# Patient Record
Sex: Male | Born: 1935 | Race: White | Hispanic: No | Marital: Married | Smoking: Former smoker
Health system: Southern US, Community
[De-identification: ages and names within clinical notes are randomized; demographics above are authoritative.]

## PROBLEM LIST (undated history)

## (undated) DIAGNOSIS — Z8719 Personal history of other diseases of the digestive system: Secondary | ICD-10-CM

## (undated) DIAGNOSIS — I10 Essential (primary) hypertension: Secondary | ICD-10-CM

## (undated) DIAGNOSIS — Z955 Presence of coronary angioplasty implant and graft: Secondary | ICD-10-CM

## (undated) DIAGNOSIS — C61 Malignant neoplasm of prostate: Secondary | ICD-10-CM

## (undated) DIAGNOSIS — I219 Acute myocardial infarction, unspecified: Secondary | ICD-10-CM

## (undated) DIAGNOSIS — I4891 Unspecified atrial fibrillation: Secondary | ICD-10-CM

## (undated) DIAGNOSIS — E119 Type 2 diabetes mellitus without complications: Secondary | ICD-10-CM

## (undated) DIAGNOSIS — I2510 Atherosclerotic heart disease of native coronary artery without angina pectoris: Secondary | ICD-10-CM

## (undated) DIAGNOSIS — E785 Hyperlipidemia, unspecified: Secondary | ICD-10-CM

## (undated) DIAGNOSIS — K219 Gastro-esophageal reflux disease without esophagitis: Secondary | ICD-10-CM

## (undated) HISTORY — PX: PROSTATECTOMY: SHX69

## (undated) HISTORY — PX: CORONARY ARTERY BYPASS GRAFT: SHX141

---

## 2004-07-30 ENCOUNTER — Ambulatory Visit: Payer: Self-pay

## 2005-09-10 ENCOUNTER — Inpatient Hospital Stay: Payer: Self-pay | Admitting: Gastroenterology

## 2005-09-24 ENCOUNTER — Ambulatory Visit: Payer: Self-pay | Admitting: Gastroenterology

## 2007-07-01 ENCOUNTER — Ambulatory Visit: Payer: Self-pay | Admitting: Oncology

## 2007-07-08 ENCOUNTER — Ambulatory Visit: Payer: Self-pay | Admitting: Oncology

## 2007-07-31 ENCOUNTER — Ambulatory Visit: Payer: Self-pay | Admitting: Oncology

## 2007-08-31 ENCOUNTER — Ambulatory Visit: Payer: Self-pay | Admitting: Oncology

## 2007-10-26 ENCOUNTER — Ambulatory Visit: Payer: Self-pay | Admitting: Internal Medicine

## 2007-10-31 ENCOUNTER — Ambulatory Visit: Payer: Self-pay | Admitting: Oncology

## 2007-11-12 ENCOUNTER — Ambulatory Visit: Payer: Self-pay | Admitting: Internal Medicine

## 2007-11-18 ENCOUNTER — Ambulatory Visit: Payer: Self-pay | Admitting: Oncology

## 2007-12-01 ENCOUNTER — Ambulatory Visit: Payer: Self-pay | Admitting: Oncology

## 2007-12-01 ENCOUNTER — Ambulatory Visit: Payer: Self-pay | Admitting: Internal Medicine

## 2008-05-02 ENCOUNTER — Ambulatory Visit: Payer: Self-pay | Admitting: Oncology

## 2008-05-25 ENCOUNTER — Ambulatory Visit: Payer: Self-pay | Admitting: Oncology

## 2008-05-30 ENCOUNTER — Ambulatory Visit: Payer: Self-pay | Admitting: Oncology

## 2008-11-30 ENCOUNTER — Ambulatory Visit: Payer: Self-pay | Admitting: Oncology

## 2008-12-30 ENCOUNTER — Ambulatory Visit: Payer: Self-pay | Admitting: Oncology

## 2010-09-26 ENCOUNTER — Ambulatory Visit: Payer: Self-pay | Admitting: Oncology

## 2010-09-26 ENCOUNTER — Ambulatory Visit: Payer: Self-pay | Admitting: Gastroenterology

## 2010-09-30 ENCOUNTER — Ambulatory Visit: Payer: Self-pay | Admitting: Oncology

## 2010-10-02 ENCOUNTER — Ambulatory Visit: Payer: Self-pay | Admitting: Oncology

## 2010-10-23 ENCOUNTER — Ambulatory Visit: Payer: Self-pay | Admitting: Gastroenterology

## 2010-10-25 LAB — PATHOLOGY REPORT

## 2010-10-31 ENCOUNTER — Ambulatory Visit: Payer: Self-pay | Admitting: Oncology

## 2010-12-01 ENCOUNTER — Ambulatory Visit: Payer: Self-pay | Admitting: Oncology

## 2010-12-11 ENCOUNTER — Ambulatory Visit: Payer: Self-pay | Admitting: Gastroenterology

## 2011-02-01 ENCOUNTER — Ambulatory Visit: Payer: Self-pay | Admitting: Oncology

## 2011-03-02 ENCOUNTER — Ambulatory Visit: Payer: Self-pay | Admitting: Oncology

## 2011-05-03 ENCOUNTER — Ambulatory Visit: Payer: Self-pay | Admitting: Oncology

## 2011-05-03 LAB — CBC CANCER CENTER
Basophil %: 0.7 %
Eosinophil #: 0.1 x10 3/mm (ref 0.0–0.7)
Lymphocyte %: 23.6 %
MCHC: 33 g/dL (ref 32.0–36.0)
Monocyte #: 0.6 x10 3/mm (ref 0.0–0.7)
Monocyte %: 7.3 %
Neutrophil #: 5.2 x10 3/mm (ref 1.4–6.5)
Platelet: 209 x10 3/mm (ref 150–440)
RDW: 14.8 % — ABNORMAL HIGH (ref 11.5–14.5)
WBC: 7.8 x10 3/mm (ref 3.8–10.6)

## 2011-05-03 LAB — IRON AND TIBC
Iron Bind.Cap.(Total): 367 ug/dL (ref 250–450)
Iron Saturation: 25 %
Iron: 91 ug/dL (ref 65–175)
Unbound Iron-Bind.Cap.: 276 ug/dL

## 2011-05-31 ENCOUNTER — Ambulatory Visit: Payer: Self-pay | Admitting: Oncology

## 2012-06-10 ENCOUNTER — Ambulatory Visit: Payer: Self-pay | Admitting: Gastroenterology

## 2012-06-23 ENCOUNTER — Ambulatory Visit: Payer: Self-pay | Admitting: Gastroenterology

## 2012-07-27 ENCOUNTER — Telehealth: Payer: Self-pay

## 2012-07-27 ENCOUNTER — Other Ambulatory Visit: Payer: Self-pay

## 2012-07-27 DIAGNOSIS — K319 Disease of stomach and duodenum, unspecified: Secondary | ICD-10-CM

## 2012-07-28 ENCOUNTER — Telehealth: Payer: Self-pay | Admitting: Gastroenterology

## 2012-07-28 NOTE — Telephone Encounter (Signed)
Received a phone call from Mr. Rison' daughter, Lynden Ang, stating that per Dr. Marva Panda the EUS scheduled for 5/8 is to be cancelled.  If there are any questions, call Dr. Marva Panda at [phone]

## 2012-07-28 NOTE — Telephone Encounter (Signed)
Pt has been instructed and meds reviewed pt's daughter will call back with any questions or concerns.  Info also mailed

## 2012-08-04 NOTE — Telephone Encounter (Signed)
I have called and confirmed with Bjorn Loser, Dr Reyes Ivan nurse that patient is indeed to be cancelled for EUS on 08/06/12. Noreene Larsson with endoscopy has been made aware of this and Dr Christella Hartigan has been made aware as well.

## 2012-08-06 ENCOUNTER — Ambulatory Visit (HOSPITAL_COMMUNITY): Admission: RE | Admit: 2012-08-06 | Payer: Medicare Other | Source: Ambulatory Visit | Admitting: Gastroenterology

## 2012-08-06 ENCOUNTER — Encounter (HOSPITAL_COMMUNITY): Admission: RE | Payer: Self-pay | Source: Ambulatory Visit

## 2012-08-06 SURGERY — UPPER ENDOSCOPIC ULTRASOUND (EUS) LINEAR
Anesthesia: Monitor Anesthesia Care

## 2012-10-20 ENCOUNTER — Encounter: Payer: Self-pay | Admitting: Nurse Practitioner

## 2012-10-20 ENCOUNTER — Encounter: Payer: Self-pay | Admitting: Cardiothoracic Surgery

## 2012-10-30 ENCOUNTER — Encounter: Payer: Self-pay | Admitting: Cardiothoracic Surgery

## 2012-12-26 ENCOUNTER — Inpatient Hospital Stay: Payer: Self-pay | Admitting: Internal Medicine

## 2012-12-26 LAB — COMPREHENSIVE METABOLIC PANEL
Alkaline Phosphatase: 113 U/L (ref 50–136)
Anion Gap: 9 (ref 7–16)
Calcium, Total: 9.3 mg/dL (ref 8.5–10.1)
Creatinine: 1.16 mg/dL (ref 0.60–1.30)
EGFR (Non-African Amer.): 58 — ABNORMAL LOW
Osmolality: 293 (ref 275–301)
Potassium: 4.7 mmol/L (ref 3.5–5.1)
SGOT(AST): 24 U/L (ref 15–37)
SGPT (ALT): 20 U/L (ref 12–78)
Total Protein: 7.7 g/dL (ref 6.4–8.2)

## 2012-12-26 LAB — URINALYSIS, COMPLETE
Bacteria: NONE SEEN
Bilirubin,UR: NEGATIVE
Blood: NEGATIVE
Ketone: NEGATIVE
Leukocyte Esterase: NEGATIVE
Nitrite: NEGATIVE
Ph: 5 (ref 4.5–8.0)
Specific Gravity: 1.005 (ref 1.003–1.030)
Squamous Epithelial: <1
WBC UR: <1 /HPF (ref 0–5)

## 2012-12-26 LAB — CBC
HCT: 36.5 % — ABNORMAL LOW (ref 40.0–52.0)
HGB: 11.4 g/dL — ABNORMAL LOW (ref 13.0–18.0)
MCH: 24.1 pg — ABNORMAL LOW (ref 26.0–34.0)
MCHC: 31.1 g/dL — ABNORMAL LOW (ref 32.0–36.0)
MCV: 78 fL — ABNORMAL LOW (ref 80–100)
Platelet: 375 10*3/uL (ref 150–440)
RBC: 4.71 10*6/uL (ref 4.40–5.90)
WBC: 12.3 10*3/uL — ABNORMAL HIGH (ref 3.8–10.6)

## 2012-12-26 LAB — CK TOTAL AND CKMB (NOT AT ARMC): CK-MB: 3.5 ng/mL (ref 0.5–3.6)

## 2012-12-27 LAB — COMPREHENSIVE METABOLIC PANEL
Albumin: 3 g/dL — ABNORMAL LOW (ref 3.4–5.0)
Alkaline Phosphatase: 87 U/L (ref 50–136)
Anion Gap: 3 — ABNORMAL LOW (ref 7–16)
Bilirubin,Total: 0.3 mg/dL (ref 0.2–1.0)
Calcium, Total: 8.5 mg/dL (ref 8.5–10.1)
Chloride: 101 mmol/L (ref 98–107)
Creatinine: 1.2 mg/dL (ref 0.60–1.30)
EGFR (African American): >60
Osmolality: 282 (ref 275–301)
Potassium: 3.8 mmol/L (ref 3.5–5.1)
SGPT (ALT): 21 U/L (ref 12–78)
Sodium: 135 mmol/L — ABNORMAL LOW (ref 136–145)
Total Protein: 6.4 g/dL (ref 6.4–8.2)

## 2012-12-27 LAB — CBC WITH DIFFERENTIAL/PLATELET
Basophil #: 0.1 10*3/uL (ref 0.0–0.1)
HCT: 29.6 % — ABNORMAL LOW (ref 40.0–52.0)
HGB: 9.5 g/dL — ABNORMAL LOW (ref 13.0–18.0)
Lymphocyte %: 18.3 %
MCH: 24.2 pg — ABNORMAL LOW (ref 26.0–34.0)
Monocyte #: 0.8 x10 3/mm (ref 0.2–1.0)
Monocyte %: 8.2 %
Neutrophil #: 7 10*3/uL — ABNORMAL HIGH (ref 1.4–6.5)
Neutrophil %: 72.2 %
Platelet: 236 10*3/uL (ref 150–440)
RBC: 3.91 10*6/uL — ABNORMAL LOW (ref 4.40–5.90)

## 2012-12-27 LAB — LIPID PANEL
Cholesterol: 170 mg/dL (ref 0–200)
Ldl Cholesterol, Calc: 119 mg/dL — ABNORMAL HIGH (ref 0–100)
Triglycerides: 103 mg/dL (ref 0–200)
VLDL Cholesterol, Calc: 21 mg/dL (ref 5–40)

## 2012-12-27 LAB — APTT: Activated PTT: 91 secs — ABNORMAL HIGH (ref 23.6–35.9)

## 2012-12-27 LAB — TROPONIN I: Troponin-I: 18.18 ng/mL — ABNORMAL HIGH

## 2012-12-27 LAB — HEMOGLOBIN A1C: Hemoglobin A1C: 7.5 % — ABNORMAL HIGH (ref 4.2–6.3)

## 2012-12-28 LAB — CBC WITH DIFFERENTIAL/PLATELET
Basophil #: 0.1 10*3/uL (ref 0.0–0.1)
Eosinophil %: 1.2 %
HGB: 9.7 g/dL — ABNORMAL LOW (ref 13.0–18.0)
Lymphocyte #: 1.8 10*3/uL (ref 1.0–3.6)
Lymphocyte %: 23.2 %
MCH: 24.1 pg — ABNORMAL LOW (ref 26.0–34.0)
MCHC: 32.4 g/dL (ref 32.0–36.0)
MCV: 75 fL — ABNORMAL LOW (ref 80–100)
Monocyte #: 0.9 x10 3/mm (ref 0.2–1.0)
RBC: 4.04 10*6/uL — ABNORMAL LOW (ref 4.40–5.90)

## 2012-12-28 LAB — BASIC METABOLIC PANEL
Anion Gap: 5 — ABNORMAL LOW (ref 7–16)
EGFR (Non-African Amer.): >60
Potassium: 3.3 mmol/L — ABNORMAL LOW (ref 3.5–5.1)

## 2012-12-28 LAB — FERRITIN: Ferritin (ARMC): 16 ng/mL (ref 8–388)

## 2012-12-28 LAB — IRON AND TIBC: Unbound Iron-Bind.Cap.: 367 ug/dL

## 2012-12-28 LAB — APTT: Activated PTT: 62.1 secs — ABNORMAL HIGH (ref 23.6–35.9)

## 2012-12-29 DIAGNOSIS — I10 Essential (primary) hypertension: Secondary | ICD-10-CM | POA: Insufficient documentation

## 2012-12-29 DIAGNOSIS — I2510 Atherosclerotic heart disease of native coronary artery without angina pectoris: Secondary | ICD-10-CM | POA: Diagnosis present

## 2012-12-29 DIAGNOSIS — E119 Type 2 diabetes mellitus without complications: Secondary | ICD-10-CM

## 2012-12-29 DIAGNOSIS — I4891 Unspecified atrial fibrillation: Secondary | ICD-10-CM

## 2012-12-29 DIAGNOSIS — E785 Hyperlipidemia, unspecified: Secondary | ICD-10-CM

## 2012-12-29 HISTORY — DX: Unspecified atrial fibrillation: I48.91

## 2012-12-29 HISTORY — DX: Type 2 diabetes mellitus without complications: E11.9

## 2012-12-29 HISTORY — DX: Hyperlipidemia, unspecified: E78.5

## 2012-12-29 HISTORY — DX: Atherosclerotic heart disease of native coronary artery without angina pectoris: I25.10

## 2012-12-29 HISTORY — DX: Essential (primary) hypertension: I10

## 2012-12-31 DIAGNOSIS — Z951 Presence of aortocoronary bypass graft: Secondary | ICD-10-CM | POA: Insufficient documentation

## 2013-02-24 ENCOUNTER — Emergency Department: Payer: Self-pay | Admitting: Emergency Medicine

## 2013-02-24 ENCOUNTER — Encounter (HOSPITAL_COMMUNITY): Payer: Self-pay

## 2013-02-24 ENCOUNTER — Observation Stay (HOSPITAL_COMMUNITY): Payer: Medicare Other

## 2013-02-24 ENCOUNTER — Observation Stay (HOSPITAL_COMMUNITY)
Admission: EM | Admit: 2013-02-24 | Discharge: 2013-02-25 | Disposition: A | Discharge status: Home / Self Care | Payer: Medicare Other | Source: Other Acute Inpatient Hospital | Attending: Neurosurgery | Admitting: Neurosurgery

## 2013-02-24 DIAGNOSIS — S065X9A Traumatic subdural hemorrhage with loss of consciousness of unspecified duration, initial encounter: Secondary | ICD-10-CM | POA: Diagnosis present

## 2013-02-24 DIAGNOSIS — I252 Old myocardial infarction: Secondary | ICD-10-CM | POA: Insufficient documentation

## 2013-02-24 DIAGNOSIS — E119 Type 2 diabetes mellitus without complications: Secondary | ICD-10-CM | POA: Diagnosis present

## 2013-02-24 DIAGNOSIS — I4891 Unspecified atrial fibrillation: Secondary | ICD-10-CM

## 2013-02-24 DIAGNOSIS — W1809XA Striking against other object with subsequent fall, initial encounter: Secondary | ICD-10-CM | POA: Insufficient documentation

## 2013-02-24 DIAGNOSIS — S065XAA Traumatic subdural hemorrhage with loss of consciousness status unknown, initial encounter: Secondary | ICD-10-CM

## 2013-02-24 DIAGNOSIS — Z955 Presence of coronary angioplasty implant and graft: Secondary | ICD-10-CM

## 2013-02-24 DIAGNOSIS — R9431 Abnormal electrocardiogram [ECG] [EKG]: Secondary | ICD-10-CM | POA: Insufficient documentation

## 2013-02-24 DIAGNOSIS — I10 Essential (primary) hypertension: Secondary | ICD-10-CM

## 2013-02-24 DIAGNOSIS — K219 Gastro-esophageal reflux disease without esophagitis: Secondary | ICD-10-CM | POA: Insufficient documentation

## 2013-02-24 DIAGNOSIS — S065X0A Traumatic subdural hemorrhage without loss of consciousness, initial encounter: Principal | ICD-10-CM | POA: Insufficient documentation

## 2013-02-24 DIAGNOSIS — G319 Degenerative disease of nervous system, unspecified: Secondary | ICD-10-CM | POA: Insufficient documentation

## 2013-02-24 DIAGNOSIS — Z8719 Personal history of other diseases of the digestive system: Secondary | ICD-10-CM

## 2013-02-24 DIAGNOSIS — I2510 Atherosclerotic heart disease of native coronary artery without angina pectoris: Secondary | ICD-10-CM

## 2013-02-24 DIAGNOSIS — C61 Malignant neoplasm of prostate: Secondary | ICD-10-CM

## 2013-02-24 HISTORY — DX: Type 2 diabetes mellitus without complications: E11.9

## 2013-02-24 HISTORY — DX: Essential (primary) hypertension: I10

## 2013-02-24 HISTORY — DX: Atherosclerotic heart disease of native coronary artery without angina pectoris: I25.10

## 2013-02-24 HISTORY — DX: Gastro-esophageal reflux disease without esophagitis: K21.9

## 2013-02-24 HISTORY — DX: Malignant neoplasm of prostate: C61

## 2013-02-24 HISTORY — DX: Presence of coronary angioplasty implant and graft: Z95.5

## 2013-02-24 HISTORY — DX: Hyperlipidemia, unspecified: E78.5

## 2013-02-24 HISTORY — DX: Unspecified atrial fibrillation: I48.91

## 2013-02-24 HISTORY — DX: Personal history of other diseases of the digestive system: Z87.19

## 2013-02-24 HISTORY — DX: Acute myocardial infarction, unspecified: I21.9

## 2013-02-24 LAB — CBC
HCT: 29.1 % — ABNORMAL LOW (ref 40.0–52.0)
HGB: 9 g/dL — ABNORMAL LOW (ref 13.0–18.0)
MCH: 23 pg — ABNORMAL LOW (ref 26.0–34.0)
MCHC: 31 g/dL — ABNORMAL LOW (ref 32.0–36.0)
MCV: 74 fL — ABNORMAL LOW (ref 80–100)
RBC: 3.93 10*6/uL — ABNORMAL LOW (ref 4.40–5.90)
RDW: 16 % — ABNORMAL HIGH (ref 11.5–14.5)

## 2013-02-24 LAB — BASIC METABOLIC PANEL
Calcium, Total: 9.1 mg/dL (ref 8.5–10.1)
Chloride: 102 mmol/L (ref 98–107)
Creatinine: 0.8 mg/dL (ref 0.60–1.30)
EGFR (African American): >60
EGFR (Non-African Amer.): >60
Glucose: 111 mg/dL — ABNORMAL HIGH (ref 65–99)
Sodium: 135 mmol/L — ABNORMAL LOW (ref 136–145)

## 2013-02-24 LAB — GLUCOSE, CAPILLARY
Glucose-Capillary: 116 mg/dL — ABNORMAL HIGH (ref 70–99)
Glucose-Capillary: 185 mg/dL — ABNORMAL HIGH (ref 70–99)

## 2013-02-24 LAB — PLATELET INHIBITION P2Y12: Platelet Function  P2Y12: 320 [PRU] (ref 194–418)

## 2013-02-24 LAB — MRSA PCR SCREENING: MRSA by PCR: NEGATIVE

## 2013-02-24 MED ORDER — GLIPIZIDE 5 MG PO TABS
5.0000 mg | ORAL_TABLET | Freq: Two times a day (BID) | ORAL | Status: DC
  Administered 2013-02-24 – 2013-02-25 (×2): 5 mg via ORAL
  Filled 2013-02-24 (×4): qty 1

## 2013-02-24 MED ORDER — HYDROCODONE-ACETAMINOPHEN 5-325 MG PO TABS: 1.0000 | ORAL_TABLET | ORAL | Status: DC | PRN

## 2013-02-24 MED ORDER — SODIUM CHLORIDE 0.9 % IV SOLN
INTRAVENOUS | Status: DC
  Administered 2013-02-24: 14:00:00 via INTRAVENOUS

## 2013-02-24 MED ORDER — CARVEDILOL 12.5 MG PO TABS
12.5000 mg | ORAL_TABLET | Freq: Two times a day (BID) | ORAL | Status: DC
  Administered 2013-02-24 – 2013-02-25 (×2): 12.5 mg via ORAL
  Filled 2013-02-24 (×4): qty 1

## 2013-02-24 MED ORDER — AMIODARONE HCL 200 MG PO TABS
200.0000 mg | ORAL_TABLET | Freq: Every day | ORAL | Status: DC
  Administered 2013-02-24 – 2013-02-25 (×2): 200 mg via ORAL
  Filled 2013-02-24 (×2): qty 1

## 2013-02-24 MED ORDER — PANTOPRAZOLE SODIUM 40 MG PO TBEC
40.0000 mg | DELAYED_RELEASE_TABLET | Freq: Two times a day (BID) | ORAL | Status: DC
  Administered 2013-02-24 – 2013-02-25 (×3): 40 mg via ORAL
  Filled 2013-02-24 (×3): qty 1

## 2013-02-24 MED ORDER — METFORMIN HCL 500 MG PO TABS
1000.0000 mg | ORAL_TABLET | Freq: Two times a day (BID) | ORAL | Status: DC
  Administered 2013-02-24 – 2013-02-25 (×2): 1000 mg via ORAL
  Filled 2013-02-24 (×4): qty 2

## 2013-02-24 MED ORDER — CLOPIDOGREL BISULFATE 75 MG PO TABS
75.0000 mg | ORAL_TABLET | Freq: Every day | ORAL | Status: DC
  Administered 2013-02-24: 75 mg via ORAL
  Filled 2013-02-24 (×2): qty 1

## 2013-02-24 MED ORDER — FUROSEMIDE 40 MG PO TABS
40.0000 mg | ORAL_TABLET | Freq: Every day | ORAL | Status: DC
  Administered 2013-02-24 – 2013-02-25 (×2): 40 mg via ORAL
  Filled 2013-02-24 (×2): qty 1

## 2013-02-24 MED ORDER — POTASSIUM CHLORIDE CRYS ER 20 MEQ PO TBCR
20.0000 meq | EXTENDED_RELEASE_TABLET | Freq: Every day | ORAL | Status: DC
  Administered 2013-02-24 – 2013-02-25 (×2): 20 meq via ORAL
  Filled 2013-02-24 (×2): qty 1

## 2013-02-24 MED ORDER — ONDANSETRON HCL 4 MG PO TABS: 4.0000 mg | ORAL_TABLET | Freq: Four times a day (QID) | ORAL | Status: DC | PRN

## 2013-02-24 MED ORDER — INSULIN DETEMIR 100 UNIT/ML ~~LOC~~ SOLN
10.0000 [IU] | Freq: Every day | SUBCUTANEOUS | Status: DC
  Administered 2013-02-24: 10 [IU] via SUBCUTANEOUS
  Filled 2013-02-24 (×2): qty 0.1

## 2013-02-24 MED ORDER — SUCRALFATE 1 G PO TABS
1.0000 g | ORAL_TABLET | Freq: Three times a day (TID) | ORAL | Status: DC
  Administered 2013-02-24 – 2013-02-25 (×4): 1 g via ORAL
  Filled 2013-02-24 (×7): qty 1

## 2013-02-24 MED ORDER — CLOPIDOGREL BISULFATE 75 MG PO TABS
75.0000 mg | ORAL_TABLET | Freq: Every day | ORAL | Status: DC
  Administered 2013-02-25: 75 mg via ORAL
  Filled 2013-02-24 (×2): qty 1

## 2013-02-24 MED ORDER — ONDANSETRON HCL 4 MG/2ML IJ SOLN: 4.0000 mg | Freq: Four times a day (QID) | INTRAMUSCULAR | Status: DC | PRN

## 2013-02-24 MED ORDER — DOCUSATE SODIUM 50 MG PO CAPS
50.0000 mg | ORAL_CAPSULE | Freq: Two times a day (BID) | ORAL | Status: DC
  Filled 2013-02-24 (×4): qty 1

## 2013-02-24 MED ORDER — INSULIN ASPART 100 UNIT/ML ~~LOC~~ SOLN
0.0000 [IU] | Freq: Three times a day (TID) | SUBCUTANEOUS | Status: DC
  Administered 2013-02-25: 2 [IU] via SUBCUTANEOUS

## 2013-02-24 NOTE — H&P (Signed)
CC:  "I fell"  HPI: [name] is a 79 y.o. male transferred to [facility] from [facility] after suffering a fall. He was in the bathroom and slipped on a rug and hit the back of his head. 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 He denies any preceding lightheadedness, dizziness, or vertigo. He states he remembers the events of the fall. Currently, he denies HA, visual changes, N/V, or new N/T/W.   Of note, he had an MI treated at [facility] ~7 weeks ago which included CABG as well as cardiac stent placement. He has since been on ASA and Plavix.  PMH: Past Medical History  Diagnosis Date  . Myocardial infarction   . GERD (gastroesophageal reflux disease)     PSH: - RECENT mini-open CABG with multiple cardiac stents in OCT 2014 - Cataract surgery - Prostatectomy - Right knee surgery  SH: History  Substance Use Topics  . Smoking status: Former Smoker -- 60 years    Types: Cigarettes  . Smokeless tobacco: Current User    Types: Chew  . Alcohol Use: No    MEDS: Prior to Admission medications   Not on File    ALLERGY: Allergies  Allergen Reactions  . Statins     Muscle pain    NEUROLOGIC EXAM: Awake, alert, oriented Memory and concentration grossly intact Speech fluent, appropriate CN grossly intact Motor exam: Upper Extremities Deltoid Bicep Tricep Grip  Right 5/5 5/5 5/5 5/5  Left 5/5 5/5 5/5 5/5   Lower Extremity IP Quad PF DF EHL  Right 5/5 5/5 5/5 5/5 5/5  Left 5/5 5/5 5/5 5/5 5/5   Sensation grossly intact to LT  IMGAING: Outside CTH reviewed demonstrating left posterior falcine SDH measuring ~85mm without mass effect or MLS. No HCP or skull fx.  IMPRESSION: - 79 y.o. male s/p fall with small left pos falcine SDH, neurologically intact  PLAN: - Cont to observe in NICU - SBP < - Consult cardiology for evaluation of recent cardiac event, specifically risk in stopping ASA/Plavix given.  - Will check PRU   - Repeat CTH this pm.

## 2013-02-24 NOTE — Consult Note (Signed)
Cardiology Consult Note  Admit date: 02/24/2013 Name: [name] 79 y.o.  male DOB:  Nov 03, 1926 MRN:  [id number]  Today's date:  02/24/2013   Referring Physician:  [facility] Neurosurgery  Reason for Consultation:   Subdural hematoma, consultation regarding management of anticoagulation and atrial fibrillation  IMPRESSIONS: 1. Placement of drug-eluting stent 01/04/2013 at [facility] to the right coronary artery and treated since then with aspirin and Plavix 2. Recent 3 vessel coronary artery disease with an internal mammary bypass graft to the LAD 3. Atrial fibrillation of undetermined age of onset 4. Subdural hematoma recently 5. Hypertension  RECOMMENDATION: 1. Obtain platelet reactivity unit tests 2. Continue Plavix and discontinue aspirin 81 mg understanding that there may be some increased risk of stent thrombosis and are balanced with the risk of stent thrombosis versus the risk of a worsening subdural hematoma 3. Long-term followup with his cardiologist in [place] on discharge  HISTORY: This 79 year old male has a history of coronary artery disease and has had stenting multiple years ago in [place] to unknown vessels. He recently presented in October with rapid atrial fibrillation to [facility] and was found to have three-vessel coronary disease. He was transferred to [facility] and underwent a minimally invasive mammary bypass graft to the LAD on October 1 and then had placement of a drug-eluting stent which was a 2.75 mm x 33 mm stent on October 6. He was recommended afterwards that he be maintained on aspirin and Plavix for a minimum of 6 months and after that be maintained on Plavix and Coumadin because of atrial fibrillation. He was in rehabilitation following that and has been seen subsequently by the cardiac surgeon. He fell today and was found to have a small subdural hematoma and was transferred for management to [facility].  Cardiology is asked to evaluate him. He has felt much better since the surgery and has had no recurrence of indigestion. He is not having shortness of breath and denies PND or orthopnea. His echocardiogram done during that admission showed an ejection fraction of 50%.  Past Medical History  Diagnosis Date  . Myocardial infarction   . GERD (gastroesophageal reflux disease)   . CAD 12/29/2012    History of multiple stents and 12 years ago in [place]  Recent internal mammary graft to LAD at [facility] on October , 2014 Recent drug-eluting stents to right coronary artery, Xience 2.75 x 33 mm the mid RCA 01/01/13   . Atrial fibrillation 12/29/2012  . Type II diabetes mellitus 12/29/2012  . H/O: upper GI bleed   . Status post insertion of drug eluting coronary artery stent     01/01/2013 Xience 2.75 x 33 mm to right coronary artery at [facility]   . Hyperlipidemia 12/29/2012  . Essential hypertension 12/29/2012  . Prostate cancer 02/24/2013     Surgical history: Prostatectomy Coronary bypass grafting x1 Allergies:  is allergic to statins.   Medications: Prior to Admission medications   Not on File    Family History: No family status information on file.    Social History:   reports that he has quit smoking. His smoking use included Cigarettes. He smoked 0.00 packs per day for 60 years. His smokeless tobacco use includes Chew. He reports that he does not drink alcohol or use illicit drugs.   History   Social History Narrative  . No narrative on file    Review of Systems: He has a significant history of GI bleeding in the past on nonsteroidal  anti-inflammatory agents. He required multiple transfusions. He has had some urinary frequency as well as nocturia. He has significant arthritis involving his knee that has been an issue. He has had some unsteadiness on his feet since he has had his bypass grafting. Mild dyspnea. Other than as noted above the remainder of the review of systems is  unremarkable. Physical Exam: BP 132/52  Pulse 86  Temp(Src) 97.6 F (36.4 C) (Oral)  Resp 13  Ht 5\' 11"  (1.803 m)  Wt 85.7 kg (188 lb 15 oz)  BMI 26.36 kg/m2  SpO2 100%  General appearance: Elderly male who is mildly pale and currently appears in no acute distress Head: Normocephalic, without obvious abnormality, atraumatic, Balding male hair pattern Eyes: conjunctivae/corneas clear. PERRL, EOM's intact. Fundi benign. Neck: no adenopathy, no carotid bruit, no JVD and supple, symmetrical, trachea midline Lungs: clear to auscultation bilaterally Heart: Irregular rhythm, normal S1-S2, no S3 Abdomen: soft, non-tender; bowel sounds normal; no masses,  no organomegaly Rectal: deferred Extremities: No edema present Pulses: 2+ and symmetric Skin: Multiple ecchymoses present over forearms Neurologic: Grossly normal  Signed:  W. Ashley Royalty MD [facility]   Cardiology Consultant  02/24/2013, 3:05 PM

## 2013-02-24 NOTE — Evaluation (Signed)
Physical Therapy Evaluation Patient Details Name: [name] MRN: [id number] DOB: 1926-12-21 Today's Date: 02/24/2013 Time: 4098-1191 PT Time Calculation (min): 27 min  PT Assessment / Plan / Recommendation History of Present Illness  Pt tripped over rug in bathroom at home and fell. Pt with smal SDH. Pt just d/c'd from rehab yesterday.  Clinical Impression  Pt just d/c'd from rehab yesterday and tripped over throw rug in bathroom. Instructed pt to have daughter pick up all throw rugs to minimalize falls risk. Suspect pt functioning near baseline. Pt to benefit from HHPT upon d/c for home safety eval. Pt remains to require 24/7 supervision for safe d/c home. Pt with all DME needed.     PT Assessment  Patient needs continued PT services    Follow Up Recommendations  Home health PT;Supervision/Assistance - 24 hour    Does the patient have the potential to tolerate intense rehabilitation      Barriers to Discharge        Equipment Recommendations  None recommended by PT (pt has all equip)    Recommendations for Other Services     Frequency Min 3X/week    Precautions / Restrictions Precautions Precautions: Fall Restrictions Weight Bearing Restrictions: No   Pertinent Vitals/Pain Pt denies pain      Mobility  Bed Mobility Bed Mobility: Supine to Sit Supine to Sit: 5: Supervision;HOB elevated Details for Bed Mobility Assistance: pt with use of rails and HOB elevated, however has hospital bed at home Transfers Transfers: Sit to Stand;Stand to Sit Sit to Stand: 4: Min guard;With upper extremity assist;From bed (due to urgency to urinate) Stand to Sit: 5: Supervision;With upper extremity assist;To chair/3-in-1 Details for Transfer Assistance: v/c's for hand placement Ambulation/Gait Ambulation/Gait Assistance: 4: Min guard Ambulation Distance (Feet): 10 Feet Assistive device: Rolling walker Ambulation/Gait Assistance Details: pt with report "I can't walk far b/c of  my knee" Gait Pattern: Step-through pattern;Decreased stride length;Trunk flexed Gait velocity: slow General Gait Details: no episodes of LOB, pt reports walking household distances only Stairs: No    Exercises     PT Diagnosis: Difficulty walking;Generalized weakness  PT Problem List: Decreased strength;Decreased activity tolerance;Decreased balance;Decreased mobility PT Treatment Interventions: DME instruction;Gait training;Functional mobility training;Therapeutic activities;Therapeutic exercise     PT Goals(Current goals can be found in the care plan section) Acute Rehab PT Goals Patient Stated Goal: home PT Goal Formulation: With patient Time For Goal Achievement: 03/03/13 Potential to Achieve Goals: Good  Visit Information  Last PT Received On: 02/24/13 Assistance Needed: +1 History of Present Illness: Pt tripped over rug in bathroom at home and fell. Pt with smal SDH. Pt just d/c'd from rehab yesterday.       Prior Functioning  Home Living Family/patient expects to be discharged to:: Private residence Living Arrangements: Children Available Help at Discharge: Family;Available 24 hours/day Type of Home: House Home Access: Ramped entrance Home Layout: One level Home Equipment: Walker - 2 wheels;Walker - 4 wheels;Wheelchair - manual;Tub bench;Toilet riser;Hospital bed Prior Function Level of Independence: Needs assistance Gait / Transfers Assistance Needed: usese RW, w/c for long distances ADL's / Homemaking Assistance Needed: daughter makes meals Communication Communication: No difficulties Dominant Hand: Right    Cognition  Cognition Arousal/Alertness: Awake/alert Behavior During Therapy: WFL for tasks assessed/performed Overall Cognitive Status: Within Functional Limits for tasks assessed    Extremity/Trunk Assessment Upper Extremity Assessment Upper Extremity Assessment: Overall WFL for tasks assessed Lower Extremity Assessment Lower Extremity Assessment:  Generalized weakness;RLE deficits/detail RLE Deficits / Details: weak knee,  uses brace when walking outside Cervical / Trunk Assessment Cervical / Trunk Assessment: Normal   Balance Balance Balance Assessed: Yes Static Standing Balance Static Standing - Balance Support: No upper extremity supported;During functional activity (using urinal) Static Standing - Level of Assistance: 4: Min assist Static Standing - Comment/# of Minutes: pt required assist for urinal placement, stood x 3 min  End of Session PT - End of Session Equipment Utilized During Treatment: Gait belt Activity Tolerance: Patient tolerated treatment well Patient left: in chair;with call bell/phone within reach Nurse Communication: Mobility status  GP Functional Assessment Tool Used: clinical judgement Functional Limitation: Mobility: Walking and moving around Mobility: Walking and Moving Around Current Status (V7846): At least 1 percent but less than 20 percent impaired, limited or restricted Mobility: Walking and Moving Around Goal Status [phone]): At least 1 percent but less than 20 percent impaired, limited or restricted   Marcene Brawn 02/24/2013, 5:09 PM  Lewis Shock, PT, DPT Pager #: [phone] Office #: [phone]

## 2013-02-25 DIAGNOSIS — I2510 Atherosclerotic heart disease of native coronary artery without angina pectoris: Secondary | ICD-10-CM

## 2013-02-25 DIAGNOSIS — I4891 Unspecified atrial fibrillation: Secondary | ICD-10-CM

## 2013-02-25 DIAGNOSIS — I10 Essential (primary) hypertension: Secondary | ICD-10-CM

## 2013-02-25 LAB — CBC
HCT: 29.8 % — ABNORMAL LOW (ref 39.0–52.0)
Hemoglobin: 8.8 g/dL — ABNORMAL LOW (ref 13.0–17.0)
MCH: 23.3 pg — ABNORMAL LOW (ref 26.0–34.0)
MCHC: 29.5 g/dL — ABNORMAL LOW (ref 30.0–36.0)
RDW: 15.4 % (ref 11.5–15.5)

## 2013-02-25 LAB — BASIC METABOLIC PANEL
BUN: 22 mg/dL (ref 6–23)
Calcium: 9.1 mg/dL (ref 8.4–10.5)
Chloride: 101 mEq/L (ref 96–112)
GFR calc Af Amer: >90 mL/min (ref >90)
GFR calc non Af Amer: 79 mL/min — ABNORMAL LOW (ref >90)
Glucose, Bld: 59 mg/dL — ABNORMAL LOW (ref 70–99)
Potassium: 3.9 mEq/L (ref 3.5–5.1)
Sodium: 136 mEq/L (ref 135–145)

## 2013-02-25 LAB — GLUCOSE, CAPILLARY: Glucose-Capillary: 122 mg/dL — ABNORMAL HIGH (ref 70–99)

## 2013-02-25 NOTE — Discharge Summary (Signed)
Physician Discharge Summary  Patient ID: [name] MRN: [id number] DOB/AGE: 10-08-1926 79 y.o.  Admit date: 02/24/2013 Discharge date: 02/25/2013  Admission Diagnoses:  Discharge Diagnoses:  Active Problems:   Subdural hematoma   CAD   Atrial fibrillation   Type II diabetes mellitus   H/O: upper GI bleed   Status post insertion of drug eluting coronary artery stent   Discharged Condition: good  Hospital Course: Admitted after fall and transfer from [facility]. Showed small amout of falcine blood. No mass effect. Repeat CT shows no change. Awake and alert morning after admit. Tolerating diet. Home day 1, specific instructions given.  Consults: None  Significant Diagnostic Studies: none  Treatments: none  Discharge Exam: Blood pressure 109/39, pulse 87, temperature 97.5 F (36.4 C), temperature source Oral, resp. rate 17, height 5\' 11"  (1.803 m), weight 85.7 kg (188 lb 15 oz), SpO2 97.00%. awake, alert, conversant; no new neuro issues.  Disposition: Final discharge disposition not confirmed     Medication List    ASK your doctor about these medications       acetaminophen 500 MG tablet  Commonly known as:  TYLENOL  Take 500 mg by mouth every 6 (six) hours as needed.     amiodarone 200 MG tablet  Commonly known as:  PACERONE  Take 200 mg by mouth daily.     amLODipine 10 MG tablet  Commonly known as:  NORVASC  Take 10 mg by mouth daily.     aspirin 81 MG tablet  Take 81 mg by mouth daily.     carvedilol 12.5 MG tablet  Commonly known as:  COREG  Take 12.5 mg by mouth 2 (two) times daily with a meal.     Cholecalciferol 1000 UNITS tablet  Take 1,000 Units by mouth daily.     clopidogrel 75 MG tablet  Commonly known as:  PLAVIX  Take 75 mg by mouth daily with breakfast.     cyanocobalamin 1000 MCG/ML injection  Commonly known as:  (VITAMIN B-12)  Inject 1,000 mcg into the muscle every 30 (thirty) days.     furosemide 40 MG tablet  Commonly known  as:  LASIX  Take 20 mg by mouth daily.     glipiZIDE-metformin 2.5-500 MG per tablet  Commonly known as:  METAGLIP  Take 2 tablets by mouth 2 (two) times daily before a meal.     insulin detemir 100 UNIT/ML injection  Commonly known as:  LEVEMIR  Inject 15 Units into the skin at bedtime.     multivitamin with minerals Tabs tablet  Take 1 tablet by mouth daily.     niacin 750 MG CR tablet  Commonly known as:  NIASPAN  Take 750 mg by mouth at bedtime.     nystatin ointment  Commonly known as:  MYCOSTATIN  Apply 1 application topically 2 (two) times daily.     oxycodone 5 MG capsule  Commonly known as:  OXY-IR  Take 5 mg by mouth every 6 (six) hours as needed.     pantoprazole 40 MG tablet  Commonly known as:  PROTONIX  Take 40 mg by mouth 2 (two) times daily.     polyethylene glycol packet  Commonly known as:  MIRALAX / GLYCOLAX  Take 17 g by mouth daily.     potassium chloride 10 MEQ tablet  Commonly known as:  K-DUR,KLOR-CON  Take 20 mEq by mouth daily.     RABEprazole 20 MG tablet  Commonly known as:  ACIPHEX  Take  20 mg by mouth 2 (two) times daily.     ramipril 10 MG capsule  Commonly known as:  ALTACE  Take 10 mg by mouth daily.     sennosides-docusate sodium 8.6-50 MG tablet  Commonly known as:  SENOKOT-S  Take 2 tablets by mouth 2 (two) times daily as needed for constipation.     sucralfate 1 G tablet  Commonly known as:  CARAFATE  Take 1 g by mouth 2 (two) times daily.         At home rest most of the time. Get up 9 or 10 times each day and take a 15 or 20 minute walk. No riding in the car and to your first postoperative appointment. If you have neck surgery you may shower from the chest down starting on the third postoperative day. If you had back surgery he may start showering on the third postoperative day with saran wrap wrapped around your incisional area 3 times. After the shower remove the saran wrap. Take pain medicine as needed and other  medications as instructed. Call my office for an appointment.  SignedReinaldo Meeker, MD 02/25/2013, 8:58 AM

## 2013-02-25 NOTE — Progress Notes (Signed)
SUBJECTIVE:  sleeping  OBJECTIVE:   Vitals:   Filed Vitals:   02/25/13 0600 02/25/13 0700 02/25/13 0800 02/25/13 0809  BP: 121/43 103/60 109/39 109/39  Pulse: 84 83 88 87  Temp:  97.5 F (36.4 C)    TempSrc:  Oral    Resp: 16 12 17    Height:      Weight:      SpO2: 100% 98% 97%    I&O's:   Intake/Output Summary (Last 24 hours) at 02/25/13 0848 Last data filed at 02/25/13 0800  Gross per 24 hour  Intake 888.33 ml  Output   1675 ml  Net -786.67 ml   TELEMETRY: Reviewed telemetry pt in NSR:     PHYSICAL EXAM General: Well developed, well nourished, in no acute distress Head: Eyes PERRLA, No xanthomas.   Normal cephalic and atramatic  Lungs:   Clear bilaterally to auscultation and percussion. Heart:   HRRR S1 S2 Pulses are 2+ & equal. Abdomen: Bowel sounds are positive, abdomen soft and non-tender without masses Extremities:   No clubbing, cyanosis or edema.  DP +1 Neuro: Alert and oriented X 3. Psych:  Good affect, responds appropriately   LABS: Basic Metabolic Panel:  Recent Labs  09/81/19 0420  NA 136  K 3.9  CL 101  CO2 25  GLUCOSE 59*  BUN 22  CREATININE 0.81  CALCIUM 9.1   Liver Function Tests: No results found for this basename: AST, ALT, ALKPHOS, BILITOT, PROT, ALBUMIN,  in the last 72 hours No results found for this basename: LIPASE, AMYLASE,  in the last 72 hours CBC:  Recent Labs  02/25/13 0420  WBC 6.6  HGB 8.8*  HCT 29.8*  MCV 78.8  PLT 203   Cardiac Enzymes: No results found for this basename: CKTOTAL, CKMB, CKMBINDEX, TROPONINI,  in the last 72 hours BNP: No components found with this basename: POCBNP,  D-Dimer: No results found for this basename: DDIMER,  in the last 72 hours Hemoglobin A1C: No results found for this basename: HGBA1C,  in the last 72 hours Fasting Lipid Panel: No results found for this basename: CHOL, HDL, LDLCALC, TRIG, CHOLHDL, LDLDIRECT,  in the last 72 hours Thyroid Function Tests: No results found for  this basename: TSH, T4TOTAL, FREET3, T3FREE, THYROIDAB,  in the last 72 hours Anemia Panel: No results found for this basename: VITAMINB12, FOLATE, FERRITIN, TIBC, IRON, RETICCTPCT,  in the last 72 hours Coag Panel:   No results found for this basename: INR, PROTIME    RADIOLOGY: Ct Head Wo Contrast  02/24/2013   CLINICAL DATA:  Followup traumatic subdural hematoma.  EXAM: CT HEAD WITHOUT CONTRAST  TECHNIQUE: Contiguous axial images were obtained from the base of the skull through the vertex without intravenous contrast.  COMPARISON:  02/24/2013  FINDINGS: Small posterior left parafalcine subdural hematoma remains stable, measuring up to 6 mm in maximal thickness. No other areas of intracranial hemorrhage identified. No evidence of brain edema, mass-effect, or other signs of acute cerebral infarction.  Moderate diffuse cerebral atrophy remains stable. Ventricles are stable in size. No skull abnormality identified.  IMPRESSION: Stable small posterior left parafalcine subdural hematoma, without significant mass effect.  Stable diffuse cerebral atrophy.   Electronically Signed   By: Myles Rosenthal M.D.   On: 02/24/2013 23:57    IMPRESSIONS:  1. Placement of drug-eluting stent 01/04/2013 at [facility] to the right coronary artery and treated since then with aspirin and Plavix  2. Recent 3 vessel coronary artery disease with an  internal mammary bypass graft to the LAD  3. Atrial fibrillation of undetermined age of onset rate controlled 4. Subdural hematoma recently  5. Hypertension - controlled RECOMMENDATION:  1. Continue Plavix and discontinue aspirin 81 mg understanding that there may be some increased risk of stent thrombosis and are balanced with the risk of stent thrombosis versus the risk of a worsening subdural hematoma  2. Long-term followup with his cardiologist in [place] on discharge    Quintella Reichert, MD  02/25/2013  8:48 AM

## 2013-02-25 NOTE — Progress Notes (Signed)
CT reviewed, unchanged from prior CT at [facility]. Cont with Plavix, hold ASA.

## 2013-02-26 LAB — GLUCOSE, CAPILLARY: Glucose-Capillary: 130 mg/dL — ABNORMAL HIGH (ref 70–99)

## 2013-03-10 ENCOUNTER — Encounter: Payer: Self-pay | Admitting: Internal Medicine

## 2013-04-06 ENCOUNTER — Encounter: Payer: Self-pay | Admitting: Internal Medicine

## 2013-04-19 ENCOUNTER — Ambulatory Visit: Payer: Self-pay | Admitting: Oncology

## 2013-04-19 ENCOUNTER — Ambulatory Visit: Payer: Self-pay | Admitting: Gastroenterology

## 2013-04-19 LAB — CBC CANCER CENTER
Basophil #: 0.1 x10 3/mm (ref 0.0–0.1)
Basophil %: 1.3 %
Eosinophil #: 0.2 x10 3/mm (ref 0.0–0.7)
Eosinophil %: 2 %
HCT: 27.7 % — ABNORMAL LOW (ref 40.0–52.0)
HGB: 8 g/dL — ABNORMAL LOW (ref 13.0–18.0)
Lymphocyte #: 1.8 x10 3/mm (ref 1.0–3.6)
Lymphocyte %: 23.8 %
MCH: 19.8 pg — ABNORMAL LOW (ref 26.0–34.0)
MCHC: 29 g/dL — ABNORMAL LOW (ref 32.0–36.0)
MCV: 68 fL — ABNORMAL LOW (ref 80–100)
Monocyte #: 0.8 x10 3/mm (ref 0.2–1.0)
Monocyte %: 10.2 %
Neutrophil #: 4.6 x10 3/mm (ref 1.4–6.5)
Neutrophil %: 62.7 %
Platelet: 284 x10 3/mm (ref 150–440)
RBC: 4.05 10*6/uL — ABNORMAL LOW (ref 4.40–5.90)
RDW: 17.9 % — ABNORMAL HIGH (ref 11.5–14.5)
WBC: 7.4 x10 3/mm (ref 3.8–10.6)

## 2013-04-19 LAB — FOLATE: FOLIC ACID: 25.9 ng/mL (ref 3.1–100.0)

## 2013-04-19 LAB — IRON AND TIBC
IRON BIND. CAP.(TOTAL): 444 ug/dL (ref 250–450)
IRON SATURATION: 4 %
Iron: 18 ug/dL — ABNORMAL LOW (ref 65–175)
Unbound Iron-Bind.Cap.: 426 ug/dL

## 2013-04-19 LAB — FERRITIN: FERRITIN (ARMC): 16 ng/mL (ref 8–388)

## 2013-04-19 LAB — LACTATE DEHYDROGENASE: LDH: 142 U/L (ref 85–241)

## 2013-05-02 ENCOUNTER — Ambulatory Visit: Payer: Self-pay | Admitting: Oncology

## 2013-05-02 ENCOUNTER — Encounter: Payer: Self-pay | Admitting: Internal Medicine

## 2013-05-30 ENCOUNTER — Encounter: Payer: Self-pay | Admitting: Internal Medicine

## 2013-06-17 ENCOUNTER — Ambulatory Visit: Payer: Self-pay | Admitting: Oncology

## 2013-06-17 LAB — CBC CANCER CENTER
BASOS PCT: 0.8 %
Basophil #: 0.1 x10 3/mm (ref 0.0–0.1)
Eosinophil #: 0.2 x10 3/mm (ref 0.0–0.7)
Eosinophil %: 2.4 %
HCT: 35.8 % — ABNORMAL LOW (ref 40.0–52.0)
HGB: 11.1 g/dL — AB (ref 13.0–18.0)
Lymphocyte #: 1.5 x10 3/mm (ref 1.0–3.6)
Lymphocyte %: 21.1 %
MCH: 25 pg — ABNORMAL LOW (ref 26.0–34.0)
MCHC: 31.1 g/dL — ABNORMAL LOW (ref 32.0–36.0)
MCV: 80 fL (ref 80–100)
MONO ABS: 0.7 x10 3/mm (ref 0.2–1.0)
MONOS PCT: 9.5 %
NEUTROS PCT: 66.2 %
Neutrophil #: 4.8 x10 3/mm (ref 1.4–6.5)
Platelet: 212 x10 3/mm (ref 150–440)
RBC: 4.46 10*6/uL (ref 4.40–5.90)
RDW: 20.5 % — ABNORMAL HIGH (ref 11.5–14.5)
WBC: 7.3 x10 3/mm (ref 3.8–10.6)

## 2013-06-17 LAB — IRON AND TIBC
IRON BIND. CAP.(TOTAL): 418 ug/dL (ref 250–450)
IRON SATURATION: 7 %
IRON: 30 ug/dL — AB (ref 65–175)
Unbound Iron-Bind.Cap.: 388 ug/dL

## 2013-06-17 LAB — FERRITIN: Ferritin (ARMC): 18 ng/mL (ref 8–388)

## 2013-06-30 ENCOUNTER — Ambulatory Visit: Payer: Self-pay | Admitting: Oncology

## 2013-09-17 ENCOUNTER — Ambulatory Visit: Payer: Self-pay | Admitting: Oncology

## 2013-09-17 LAB — CBC CANCER CENTER
Basophil #: 0.1 x10 3/mm (ref 0.0–0.1)
Basophil %: 1.1 %
Eosinophil #: 0.1 x10 3/mm (ref 0.0–0.7)
Eosinophil %: 1.4 %
HCT: 34.5 % — AB (ref 40.0–52.0)
HGB: 10.8 g/dL — ABNORMAL LOW (ref 13.0–18.0)
LYMPHS ABS: 1.3 x10 3/mm (ref 1.0–3.6)
LYMPHS PCT: 14.4 %
MCH: 24.9 pg — ABNORMAL LOW (ref 26.0–34.0)
MCHC: 31.2 g/dL — ABNORMAL LOW (ref 32.0–36.0)
MCV: 80 fL (ref 80–100)
MONOS PCT: 7.4 %
Monocyte #: 0.6 x10 3/mm (ref 0.2–1.0)
NEUTROS PCT: 75.7 %
Neutrophil #: 6.6 x10 3/mm — ABNORMAL HIGH (ref 1.4–6.5)
PLATELETS: 223 x10 3/mm (ref 150–440)
RBC: 4.33 10*6/uL — ABNORMAL LOW (ref 4.40–5.90)
RDW: 17.3 % — ABNORMAL HIGH (ref 11.5–14.5)
WBC: 8.7 x10 3/mm (ref 3.8–10.6)

## 2013-09-17 LAB — IRON AND TIBC
IRON: 33 ug/dL — AB (ref 65–175)
Iron Bind.Cap.(Total): 428 ug/dL (ref 250–450)
Iron Saturation: 8 %
UNBOUND IRON-BIND. CAP.: 395 ug/dL

## 2013-09-17 LAB — FERRITIN: Ferritin (ARMC): 10 ng/mL (ref 8–388)

## 2013-09-29 ENCOUNTER — Ambulatory Visit: Payer: Self-pay | Admitting: Oncology

## 2013-11-23 ENCOUNTER — Emergency Department: Payer: Self-pay | Admitting: Internal Medicine

## 2013-11-23 LAB — BASIC METABOLIC PANEL
Anion Gap: 10 (ref 7–16)
BUN: 23 mg/dL — ABNORMAL HIGH (ref 7–18)
CHLORIDE: 99 mmol/L (ref 98–107)
CO2: 28 mmol/L (ref 21–32)
Calcium, Total: 9.1 mg/dL (ref 8.5–10.1)
Creatinine: 0.95 mg/dL (ref 0.60–1.30)
GLUCOSE: 288 mg/dL — AB (ref 65–99)
Osmolality: 288 (ref 275–301)
POTASSIUM: 4.9 mmol/L (ref 3.5–5.1)
Sodium: 137 mmol/L (ref 136–145)

## 2013-11-23 LAB — CBC
HCT: 41.8 % (ref 40.0–52.0)
HGB: 13.6 g/dL (ref 13.0–18.0)
MCH: 28.8 pg (ref 26.0–34.0)
MCHC: 32.5 g/dL (ref 32.0–36.0)
MCV: 89 fL (ref 80–100)
Platelet: 177 10*3/uL (ref 150–440)
RBC: 4.71 10*6/uL (ref 4.40–5.90)
RDW: 17.6 % — ABNORMAL HIGH (ref 11.5–14.5)
WBC: 8.3 10*3/uL (ref 3.8–10.6)

## 2013-11-23 LAB — TROPONIN I: Troponin-I: <0.02 ng/mL

## 2013-11-23 LAB — PRO B NATRIURETIC PEPTIDE: B-Type Natriuretic Peptide: 1072 pg/mL — ABNORMAL HIGH (ref 0–450)

## 2013-12-22 ENCOUNTER — Ambulatory Visit: Payer: Self-pay | Admitting: Oncology

## 2013-12-22 LAB — IRON AND TIBC
IRON SATURATION: 25 %
Iron Bind.Cap.(Total): 335 ug/dL (ref 250–450)
Iron: 84 ug/dL (ref 65–175)
UNBOUND IRON-BIND. CAP.: 251 ug/dL

## 2013-12-22 LAB — CBC CANCER CENTER
Basophil #: 0.1 x10 3/mm (ref 0.0–0.1)
Basophil %: 1.1 %
EOS ABS: 0.2 x10 3/mm (ref 0.0–0.7)
Eosinophil %: 2.6 %
HCT: 41.4 % (ref 40.0–52.0)
HGB: 13.6 g/dL (ref 13.0–18.0)
Lymphocyte #: 2.1 x10 3/mm (ref 1.0–3.6)
Lymphocyte %: 23.1 %
MCH: 29.2 pg (ref 26.0–34.0)
MCHC: 32.9 g/dL (ref 32.0–36.0)
MCV: 89 fL (ref 80–100)
MONOS PCT: 8.3 %
Monocyte #: 0.8 x10 3/mm (ref 0.2–1.0)
NEUTROS PCT: 64.9 %
Neutrophil #: 5.9 x10 3/mm (ref 1.4–6.5)
PLATELETS: 195 x10 3/mm (ref 150–440)
RBC: 4.65 10*6/uL (ref 4.40–5.90)
RDW: 15.3 % — ABNORMAL HIGH (ref 11.5–14.5)
WBC: 9.1 x10 3/mm (ref 3.8–10.6)

## 2013-12-22 LAB — FERRITIN: FERRITIN (ARMC): 26 ng/mL (ref 8–388)

## 2013-12-30 ENCOUNTER — Ambulatory Visit: Payer: Self-pay | Admitting: Oncology

## 2013-12-31 ENCOUNTER — Emergency Department: Payer: Self-pay | Admitting: Emergency Medicine

## 2014-01-01 LAB — CBC
HCT: 39.8 % — AB (ref 40.0–52.0)
HGB: 12.9 g/dL — ABNORMAL LOW (ref 13.0–18.0)
MCH: 28.9 pg (ref 26.0–34.0)
MCHC: 32.4 g/dL (ref 32.0–36.0)
MCV: 89 fL (ref 80–100)
Platelet: 203 10*3/uL (ref 150–440)
RBC: 4.47 10*6/uL (ref 4.40–5.90)
RDW: 14.8 % — ABNORMAL HIGH (ref 11.5–14.5)
WBC: 11.5 10*3/uL — AB (ref 3.8–10.6)

## 2014-01-01 LAB — URINALYSIS, COMPLETE
BACTERIA: NONE SEEN
Bilirubin,UR: NEGATIVE
Blood: NEGATIVE
Glucose,UR: NEGATIVE mg/dL (ref 0–75)
Ketone: NEGATIVE
Leukocyte Esterase: NEGATIVE
Nitrite: NEGATIVE
PH: 5 (ref 4.5–8.0)
Protein: NEGATIVE
SPECIFIC GRAVITY: 1.009 (ref 1.003–1.030)
SQUAMOUS EPITHELIAL: NONE SEEN
WBC UR: NONE SEEN /HPF (ref 0–5)

## 2014-01-01 LAB — COMPREHENSIVE METABOLIC PANEL
ALK PHOS: 78 U/L
ALT: 14 U/L
Albumin: 3.4 g/dL (ref 3.4–5.0)
Anion Gap: 7 (ref 7–16)
BUN: 24 mg/dL — ABNORMAL HIGH (ref 7–18)
Bilirubin,Total: 0.6 mg/dL (ref 0.2–1.0)
CALCIUM: 9.2 mg/dL (ref 8.5–10.1)
CHLORIDE: 102 mmol/L (ref 98–107)
CO2: 29 mmol/L (ref 21–32)
Creatinine: 1.06 mg/dL (ref 0.60–1.30)
EGFR (Non-African Amer.): >60
Glucose: 141 mg/dL — ABNORMAL HIGH (ref 65–99)
OSMOLALITY: 282 (ref 275–301)
Potassium: 3.8 mmol/L (ref 3.5–5.1)
SGOT(AST): 12 U/L — ABNORMAL LOW (ref 15–37)
Sodium: 138 mmol/L (ref 136–145)
Total Protein: 7.3 g/dL (ref 6.4–8.2)

## 2014-01-01 LAB — CK TOTAL AND CKMB (NOT AT ARMC)
CK, TOTAL: 49 U/L
CK, Total: 52 U/L
CK, Total: 49 U/L
CK-MB: 1.5 ng/mL (ref 0.5–3.6)
CK-MB: 1.3 ng/mL (ref 0.5–3.6)
CK-MB: 1.2 ng/mL (ref 0.5–3.6)

## 2014-01-01 LAB — LIPASE, BLOOD: LIPASE: 101 U/L (ref 73–393)

## 2014-01-01 LAB — MAGNESIUM: Magnesium: 1.3 mg/dL — ABNORMAL LOW

## 2014-01-01 LAB — TROPONIN I
TROPONIN-I: 0.02 ng/mL
TROPONIN-I: 0.02 ng/mL
Troponin-I: 0.02 ng/mL

## 2014-01-01 LAB — PROTIME-INR
INR: 1.1
Prothrombin Time: 14.1 secs (ref 11.5–14.7)

## 2014-01-02 ENCOUNTER — Ambulatory Visit: Payer: Self-pay | Admitting: Orthopedic Surgery

## 2014-01-02 LAB — BASIC METABOLIC PANEL
ANION GAP: 7 (ref 7–16)
BUN: 21 mg/dL — AB (ref 7–18)
CHLORIDE: 104 mmol/L (ref 98–107)
CO2: 28 mmol/L (ref 21–32)
CREATININE: 0.87 mg/dL (ref 0.60–1.30)
Calcium, Total: 8.5 mg/dL (ref 8.5–10.1)
EGFR (Non-African Amer.): >60
GLUCOSE: 155 mg/dL — AB (ref 65–99)
OSMOLALITY: 284 (ref 275–301)
Potassium: 3.4 mmol/L — ABNORMAL LOW (ref 3.5–5.1)
Sodium: 139 mmol/L (ref 136–145)

## 2014-01-02 LAB — CBC WITH DIFFERENTIAL/PLATELET
BASOS PCT: 0.7 %
Basophil #: 0.1 10*3/uL (ref 0.0–0.1)
EOS PCT: 1.3 %
Eosinophil #: 0.1 10*3/uL (ref 0.0–0.7)
HCT: 36.6 % — AB (ref 40.0–52.0)
HGB: 12.4 g/dL — ABNORMAL LOW (ref 13.0–18.0)
LYMPHS ABS: 1.2 10*3/uL (ref 1.0–3.6)
Lymphocyte %: 13.4 %
MCH: 29.7 pg (ref 26.0–34.0)
MCHC: 33.8 g/dL (ref 32.0–36.0)
MCV: 88 fL (ref 80–100)
MONO ABS: 1 x10 3/mm (ref 0.2–1.0)
MONOS PCT: 11.3 %
NEUTROS ABS: 6.6 10*3/uL — AB (ref 1.4–6.5)
Neutrophil %: 73.3 %
Platelet: 184 10*3/uL (ref 150–440)
RBC: 4.17 10*6/uL — AB (ref 4.40–5.90)
RDW: 14.6 % — ABNORMAL HIGH (ref 11.5–14.5)
WBC: 9 10*3/uL (ref 3.8–10.6)

## 2014-01-02 LAB — MAGNESIUM: MAGNESIUM: 1.8 mg/dL

## 2014-01-02 LAB — CLOSTRIDIUM DIFFICILE(ARMC)

## 2014-01-03 ENCOUNTER — Inpatient Hospital Stay: Payer: Self-pay | Admitting: Internal Medicine

## 2014-01-03 LAB — CBC WITH DIFFERENTIAL/PLATELET
BASOS ABS: 0.1 10*3/uL (ref 0.0–0.1)
Basophil %: 0.6 %
Eosinophil #: 0.2 10*3/uL (ref 0.0–0.7)
Eosinophil %: 1.9 %
HCT: 36.2 % — AB (ref 40.0–52.0)
HGB: 12.2 g/dL — AB (ref 13.0–18.0)
Lymphocyte #: 1.3 10*3/uL (ref 1.0–3.6)
Lymphocyte %: 14.4 %
MCH: 29.8 pg (ref 26.0–34.0)
MCHC: 33.7 g/dL (ref 32.0–36.0)
MCV: 89 fL (ref 80–100)
MONO ABS: 0.9 x10 3/mm (ref 0.2–1.0)
MONOS PCT: 10.3 %
Neutrophil #: 6.5 10*3/uL (ref 1.4–6.5)
Neutrophil %: 72.8 %
PLATELETS: 168 10*3/uL (ref 150–440)
RBC: 4.08 10*6/uL — AB (ref 4.40–5.90)
RDW: 14.9 % — ABNORMAL HIGH (ref 11.5–14.5)
WBC: 8.9 10*3/uL (ref 3.8–10.6)

## 2014-01-03 LAB — BASIC METABOLIC PANEL
Anion Gap: 6 — ABNORMAL LOW (ref 7–16)
BUN: 17 mg/dL (ref 7–18)
CHLORIDE: 106 mmol/L (ref 98–107)
CREATININE: 0.74 mg/dL (ref 0.60–1.30)
Calcium, Total: 8.5 mg/dL (ref 8.5–10.1)
Co2: 28 mmol/L (ref 21–32)
GLUCOSE: 78 mg/dL (ref 65–99)
OSMOLALITY: 280 (ref 275–301)
Potassium: 3 mmol/L — ABNORMAL LOW (ref 3.5–5.1)
SODIUM: 140 mmol/L (ref 136–145)

## 2014-01-04 LAB — BASIC METABOLIC PANEL
Anion Gap: 4 — ABNORMAL LOW (ref 7–16)
BUN: 12 mg/dL (ref 7–18)
CREATININE: 0.87 mg/dL (ref 0.60–1.30)
Calcium, Total: 8.2 mg/dL — ABNORMAL LOW (ref 8.5–10.1)
Chloride: 106 mmol/L (ref 98–107)
Co2: 28 mmol/L (ref 21–32)
Glucose: 128 mg/dL — ABNORMAL HIGH (ref 65–99)
OSMOLALITY: 277 (ref 275–301)
Potassium: 3.1 mmol/L — ABNORMAL LOW (ref 3.5–5.1)
Sodium: 138 mmol/L (ref 136–145)

## 2014-01-04 LAB — CBC WITH DIFFERENTIAL/PLATELET
Basophil #: 0.1 10*3/uL (ref 0.0–0.1)
Basophil %: 0.6 %
Eosinophil #: 0.1 10*3/uL (ref 0.0–0.7)
Eosinophil %: 1.5 %
HCT: 35.7 % — AB (ref 40.0–52.0)
HGB: 11.6 g/dL — ABNORMAL LOW (ref 13.0–18.0)
Lymphocyte #: 1.1 10*3/uL (ref 1.0–3.6)
Lymphocyte %: 12 %
MCH: 29.2 pg (ref 26.0–34.0)
MCHC: 32.5 g/dL (ref 32.0–36.0)
MCV: 90 fL (ref 80–100)
Monocyte #: 0.9 x10 3/mm (ref 0.2–1.0)
Monocyte %: 9.6 %
NEUTROS PCT: 76.3 %
Neutrophil #: 7.1 10*3/uL — ABNORMAL HIGH (ref 1.4–6.5)
PLATELETS: 164 10*3/uL (ref 150–440)
RBC: 3.96 10*6/uL — AB (ref 4.40–5.90)
RDW: 14.5 % (ref 11.5–14.5)
WBC: 9.3 10*3/uL (ref 3.8–10.6)

## 2014-01-05 LAB — CBC WITH DIFFERENTIAL/PLATELET
BASOS PCT: 0.6 %
Basophil #: 0.1 10*3/uL (ref 0.0–0.1)
EOS PCT: 0.9 %
Eosinophil #: 0.1 10*3/uL (ref 0.0–0.7)
HCT: 37.3 % — AB (ref 40.0–52.0)
HGB: 12.5 g/dL — ABNORMAL LOW (ref 13.0–18.0)
LYMPHS ABS: 0.9 10*3/uL — AB (ref 1.0–3.6)
Lymphocyte %: 8.1 %
MCH: 30.1 pg (ref 26.0–34.0)
MCHC: 33.6 g/dL (ref 32.0–36.0)
MCV: 90 fL (ref 80–100)
Monocyte #: 1.1 x10 3/mm — ABNORMAL HIGH (ref 0.2–1.0)
Monocyte %: 10.5 %
NEUTROS PCT: 79.9 %
Neutrophil #: 8.5 10*3/uL — ABNORMAL HIGH (ref 1.4–6.5)
PLATELETS: 177 10*3/uL (ref 150–440)
RBC: 4.16 10*6/uL — ABNORMAL LOW (ref 4.40–5.90)
RDW: 14.3 % (ref 11.5–14.5)
WBC: 10.6 10*3/uL (ref 3.8–10.6)

## 2014-01-05 LAB — BASIC METABOLIC PANEL
ANION GAP: 5 — AB (ref 7–16)
BUN: 10 mg/dL (ref 7–18)
Calcium, Total: 8.8 mg/dL (ref 8.5–10.1)
Chloride: 107 mmol/L (ref 98–107)
Co2: 27 mmol/L (ref 21–32)
Creatinine: 0.74 mg/dL (ref 0.60–1.30)
EGFR (Non-African Amer.): >60
Glucose: 171 mg/dL — ABNORMAL HIGH (ref 65–99)
OSMOLALITY: 281 (ref 275–301)
Potassium: 3.4 mmol/L — ABNORMAL LOW (ref 3.5–5.1)
SODIUM: 139 mmol/L (ref 136–145)

## 2014-01-06 LAB — BASIC METABOLIC PANEL
Anion Gap: 10 (ref 7–16)
BUN: 13 mg/dL (ref 7–18)
CALCIUM: 8.6 mg/dL (ref 8.5–10.1)
Chloride: 102 mmol/L (ref 98–107)
Co2: 26 mmol/L (ref 21–32)
Creatinine: 0.72 mg/dL (ref 0.60–1.30)
GLUCOSE: 159 mg/dL — AB (ref 65–99)
OSMOLALITY: 279 (ref 275–301)
Potassium: 3.4 mmol/L — ABNORMAL LOW (ref 3.5–5.1)
SODIUM: 138 mmol/L (ref 136–145)

## 2014-01-07 ENCOUNTER — Ambulatory Visit: Payer: Self-pay | Admitting: Oncology

## 2014-01-07 ENCOUNTER — Encounter: Payer: Self-pay | Admitting: Internal Medicine

## 2014-01-13 LAB — BASIC METABOLIC PANEL
ANION GAP: 10 (ref 7–16)
BUN: 28 mg/dL — AB (ref 7–18)
CHLORIDE: 105 mmol/L (ref 98–107)
CO2: 29 mmol/L (ref 21–32)
Calcium, Total: 8.8 mg/dL (ref 8.5–10.1)
Creatinine: 0.91 mg/dL (ref 0.60–1.30)
Glucose: 50 mg/dL — ABNORMAL LOW (ref 65–99)
OSMOLALITY: 290 (ref 275–301)
POTASSIUM: 3.4 mmol/L — AB (ref 3.5–5.1)
SODIUM: 144 mmol/L (ref 136–145)

## 2014-01-18 LAB — BASIC METABOLIC PANEL
ANION GAP: 7 (ref 7–16)
BUN: 27 mg/dL — ABNORMAL HIGH (ref 7–18)
CALCIUM: 9 mg/dL (ref 8.5–10.1)
CHLORIDE: 105 mmol/L (ref 98–107)
CO2: 30 mmol/L (ref 21–32)
CREATININE: 0.85 mg/dL (ref 0.60–1.30)
Glucose: 52 mg/dL — ABNORMAL LOW (ref 65–99)
OSMOLALITY: 286 (ref 275–301)
POTASSIUM: 3.3 mmol/L — AB (ref 3.5–5.1)
Sodium: 142 mmol/L (ref 136–145)

## 2014-01-25 LAB — BASIC METABOLIC PANEL
Anion Gap: 10 (ref 7–16)
BUN: 28 mg/dL — ABNORMAL HIGH (ref 7–18)
CALCIUM: 8.8 mg/dL (ref 8.5–10.1)
CHLORIDE: 107 mmol/L (ref 98–107)
CO2: 27 mmol/L (ref 21–32)
Creatinine: 0.85 mg/dL (ref 0.60–1.30)
EGFR (Non-African Amer.): >60
GLUCOSE: 56 mg/dL — AB (ref 65–99)
Osmolality: 290 (ref 275–301)
Potassium: 3.8 mmol/L (ref 3.5–5.1)
Sodium: 144 mmol/L (ref 136–145)

## 2014-01-30 ENCOUNTER — Encounter: Payer: Self-pay | Admitting: Internal Medicine

## 2014-02-02 LAB — CLOSTRIDIUM DIFFICILE(ARMC)

## 2014-03-01 ENCOUNTER — Encounter: Payer: Self-pay | Admitting: Internal Medicine

## 2014-04-01 ENCOUNTER — Encounter: Payer: Self-pay | Admitting: Internal Medicine

## 2014-04-26 ENCOUNTER — Ambulatory Visit: Payer: Self-pay | Admitting: Vascular Surgery

## 2014-04-26 LAB — BASIC METABOLIC PANEL
Anion Gap: 7 (ref 7–16)
BUN: 37 mg/dL — ABNORMAL HIGH (ref 7–18)
CHLORIDE: 105 mmol/L (ref 98–107)
CREATININE: 1.34 mg/dL — AB (ref 0.60–1.30)
Calcium, Total: 9.5 mg/dL (ref 8.5–10.1)
Co2: 30 mmol/L (ref 21–32)
EGFR (African American): >60
EGFR (Non-African Amer.): 54 — ABNORMAL LOW
GLUCOSE: 177 mg/dL — AB (ref 65–99)
OSMOLALITY: 296 (ref 275–301)
Potassium: 4.6 mmol/L (ref 3.5–5.1)
SODIUM: 142 mmol/L (ref 136–145)

## 2014-05-31 ENCOUNTER — Ambulatory Visit
Admit: 2014-05-31 | Disposition: A | Discharge status: Home / Self Care | Payer: Self-pay | Attending: Internal Medicine | Admitting: Internal Medicine

## 2014-06-20 ENCOUNTER — Inpatient Hospital Stay: Payer: Self-pay | Admitting: Internal Medicine

## 2014-06-23 LAB — CBC WITH DIFFERENTIAL/PLATELET
BASOS ABS: 0 10*3/uL (ref 0.0–0.1)
Basophil %: 0.5 %
Eosinophil #: 0 10*3/uL (ref 0.0–0.7)
Eosinophil %: 0.2 %
HCT: 25.7 % — AB (ref 40.0–52.0)
HGB: 7.8 g/dL — ABNORMAL LOW (ref 13.0–18.0)
LYMPHS ABS: 1.1 10*3/uL (ref 1.0–3.6)
LYMPHS PCT: 11.4 %
MCH: 25.9 pg — ABNORMAL LOW (ref 26.0–34.0)
MCHC: 30.5 g/dL — ABNORMAL LOW (ref 32.0–36.0)
MCV: 85 fL (ref 80–100)
Monocyte #: 1 x10 3/mm (ref 0.2–1.0)
Monocyte %: 10.5 %
NEUTROS ABS: 7.2 10*3/uL — AB (ref 1.4–6.5)
Neutrophil %: 77.4 %
Platelet: 242 10*3/uL (ref 150–440)
RBC: 3.02 10*6/uL — ABNORMAL LOW (ref 4.40–5.90)
RDW: 14.8 % — ABNORMAL HIGH (ref 11.5–14.5)
WBC: 9.3 10*3/uL (ref 3.8–10.6)

## 2014-06-23 LAB — BASIC METABOLIC PANEL
Anion Gap: 10 (ref 7–16)
BUN: 79 mg/dL — ABNORMAL HIGH
CO2: 25 mmol/L
CREATININE: 2.51 mg/dL — AB
Calcium, Total: 8.4 mg/dL — ABNORMAL LOW
Chloride: 101 mmol/L
EGFR (Non-African Amer.): 22 — ABNORMAL LOW
GFR CALC AF AMER: 26 — AB
GLUCOSE: 145 mg/dL — AB
Potassium: 3.6 mmol/L
Sodium: 136 mmol/L

## 2014-07-01 ENCOUNTER — Ambulatory Visit
Admit: 2014-07-01 | Disposition: A | Discharge status: Home / Self Care | Payer: Self-pay | Attending: Internal Medicine | Admitting: Internal Medicine

## 2014-07-01 DEATH — deceased

## 2014-07-22 NOTE — Discharge Summary (Signed)
PATIENT NAME:  [name], [name] MR#:  [id number] DATE OF BIRTH:  February 07, 1927  DATE OF ADMISSION:  12/26/2012 DATE OF DISCHARGE:  12/28/2012  DISCHARGE DIAGNOSES:  1.  Anterior myocardial infarction with three-vessel coronary artery disease.  2.  Type 2 diabetes.  3.  Hypertension.   DISCHARGE MEDICATIONS: Ultimately per [facility], currently being treated with heparin, beta blockers and antiplatelet agents for his MI.   HISTORY AND PHYSICAL: Please see detailed history and physical on admission.   HOSPITAL COURSE: The patient was admitted, ruled in for MI. Troponins reaching up to 17. Cardiology was consulted. Cardiac catheterization was done showing severe three vessel coronary disease with LV dysfunction of 30%. Please see cath report for further details. He did well with that. Coronary artery bypass grafting was recommended by cardiology. The patient did agree to that and ultimately suspended his DO NOT RESUSCITATE status in order to have CABG done. He was transferred to [facility] without further difficulty for such.   ____________________________ Ocie Cornfield. Ouida Sills, MD mwa:aw D: 12/29/2012 08:07:57 ET T: 12/29/2012 08:17:12 ET JOB#: [id number]  cc: Ocie Cornfield. Ouida Sills, MD, <Dictator> Kirk Ruths MD ELECTRONICALLY SIGNED 12/31/2012 13:23

## 2014-07-22 NOTE — Consult Note (Signed)
PATIENT NAME:  MELINDA, POTTINGER MR#:  [id number] DATE OF BIRTH:  [birth year]  CARDIOLOGY CONSULTATION REPORT  DATE OF CONSULTATION:  12/27/2012  CONSULTING PHYSICIAN:  Dr. Bridgett Larsson.  REASON FOR CONSULTATION: Atrial fibrillation, with rapid ventricular rate, coronary artery disease, hypertension, hyperlipidemia, sleep apnea with acute myocardial infarction.   CHIEF COMPLAINT:  Heartburn.  HISTORY OF PRESENT ILLNESS: This is an 81 year old male with known coronary artery disease status post previous stenting in 4 different arteries, hypertension, and sleep apnea, on appropriate medications with some waxing and waning heartburn-type symptoms until he had severe substernal chest discomfort radiating into the back, with atrial fibrillation with a rapid ventricular rate. The patient has had full resolution of his chest discomfort, with an EKG showing atrial fibrillation and nonspecific ST and T wave changes. The patient additionally has had an elevation of troponin of 17, consistent with sub-endocardial myocardial infarction. Heart rate is better-controlled on Cardizem at this time. He previously has been on labetalol, Norvasc, ACE inhibitor and Lasix for congestive heart failure. He has had some pulmonary edema, consistent with minimal acute systolic dysfunction, congestive heart failure.   The remainder review of systems negative for vision changes, ringing in the ears, hearing loss, cough, congestion, heartburn, nausea, vomiting, diarrhea, bloody stools, stomach pain, extremity pain, leg weakness, cramping of the buttocks, known blood clots, headaches, blackouts, dizzy spells, nosebleeds, congestion, trouble swallowing, frequent urination, urination at night, muscle weakness, numbness, anxiety, depression, skin lesions, or skin rashes.   PAST MEDICAL HISTORY: 1.  Atrial fibrillation.  2.  Hypertension.  3.  Hyperlipidemia.  4.  Sleep apnea.  5.  Coronary artery disease.   FAMILY HISTORY: No family  members with early onset of cardiovascular disease or hypertension.   SOCIAL HISTORY: Currently denies alcohol or tobacco use.   HE HAS ALLERGIES TO MEDICATIONS AS LISTED.   PHYSICAL EXAM: VITAL SIGNS: Blood pressure is 110/68 bilaterally, heart rate is 68 upright, reclining, and irregular.  GENERAL: He is a well-appearing elderly male in no acute distress.  HEENT: No icterus, thyromegaly, ulcers, hemorrhage, or xanthelasma.  CARDIOVASCULAR: Irregularly irregular with normal S1 and S2, 2/6 apical murmur consistent with mitral regurgitation. PMI is diffuse. Carotid upstroke normal, without bruit. Jugular venous pressure is normal.  LUNGS: Lungs have bibasilar crackles with a few expiratory wheezes.  ABDOMEN: Soft, nontender, without hepatosplenomegaly or masses. Abdominal aorta is normal size, without bruit.  EXTREMITIES: 2+ radial, femoral, dorsal pedal pulses, with trace lower extremity edema. No cyanosis, clubbing or ulcers.  NEUROLOGIC: He is oriented to time, place, and person, with normal mood and affect.   ASSESSMENT: An 81 year old male with hypertension, hyperlipidemia, sleep apnea, coronary artery disease, atrial fibrillation, with acute onset of atrial fibrillation and rapid ventricular rate likely, secondary to acute myocardial infarction, with mild acute systolic dysfunction, congestive heart failure.   RECOMMENDATIONS: 1.  Lasix for further treatment of systolic flow heart failure.  2. Heparin, nitroglycerin, oxygen and beta blocker for heart rate control and acute myocardial infarction.  3.  Serial ECG and enzymes to assess extent of myocardial infarction.  4.  Proceed to cardiac catheterization to assess coronary anatomy and further treatment thereof, as necessary. Patient understands the risks and benefits of cardiac catheterization. These include the possibility of death, stroke, heart attack, infection, bleeding or blood clot. He is at low risk for conscious sedation.  5.   Treatment of sleep apnea with continuous positive airway pressure machine.  6.  Statin therapy for hyperlipidemia.  7.  Further consideration  of anticoagulation at a later date after improvement of above.    ____________________________ Corey Skains, MD bjk:dm D: 12/27/2012 08:16:13 ET T: 12/27/2012 08:54:45 ET JOB#: [id number]  cc: Corey Skains, MD, <Dictator> Corey Skains MD ELECTRONICALLY SIGNED 12/29/2012 10:35

## 2014-07-22 NOTE — H&P (Signed)
PATIENT NAME:  [name] MR#:  [id number] DATE OF BIRTH:  1926/04/14  DATE OF ADMISSION:  12/26/2012  PRIMARY CARE PHYSICIAN:  Dr. Frazier Richards   REFERRING PHYSICIAN:  Dr. Benjaman Lobe   CHIEF COMPLAINT:  Shortness of breath today.   HISTORY OF PRESENT ILLNESS: An 81 year old Caucasian male with a history of hypertension, diabetes, CAD, status post 4 stents, and anemia. Was sent to ED due to shortness of breath today. The patient is alert, awake, oriented on BiPAP. According to patient, patient's son and daughter, the patient was fine until this afternoon at about 4:00 to 5:00 p.m. The patient developed shortness of breath, cough, congestion, developed severe respiratory distress. Then they called EMS and the patient was sent to ED. The patient's O2 saturation was at 80s in ED, was placed on BiPAP. The patient's heart rate was 140. EKG showed AFib. The patient denies any fever or chills. No headache or dizziness. No chest pain, but has palpitations. He denies any orthopnea or nocturnal dyspnea, but has leg edema. The patient denies any weight loss or weight gain.   PAST MEDICAL HISTORY: Hypertension, diabetes, CAD, status post 4 stents, anemia, PUD, with GI bleeding 15 years ago. The patient was advised not taking aspirin. Prostate cancer, status post surgery.   SOCIAL HISTORY:  No smoking, alcohol drinking or illicit drugs.   FAMILY HISTORY:  The patient's mother had a heart attack, brother had a heart attack, and also diabetes in his family.   SURGICAL HISTORY:  Prostate surgery.   ALLERGIES:  None.  HOME MEDICATIONS: 1.  Vitamin D3, 1000 international units 1 cap once a day.  2.  Vitamin B12 intramuscular every month. 3.  Tylenol p.o. p.r.n.  4.  Sucralfate 1 gram p.o. b.i.d.  5.  Ramipril 10 mg p.o. once a day.  6.  PreserVision AREDS 2 cap b.i.d.  7.  Potassium 10 mEq once a day.  8.  Pantoprazole 40 mg p.o. daily.  9.  Niacin extended release 750 mg p.o. 1 tab at bedtime.   10.  MiraLAX p.o. p.r.n. for constipation.  11.  Levemir 15 units subcu once a day at bedtime.  12.  Humalog 10 units subcu before lunch.  13.  Glipizide-metformin 2.5 mg-500 mg p.o. tablets 2 tablets p.o. b.i.d.  14.  Lasix 40 mg p.o. once a day.  15.  Coreg 12.5 mg p.o. b.i.d.  16.  Norvasc 2.5 mg p.o. daily.  REVIEW OF SYSTEMS:   CONSTITUTIONAL: The patient denies any fever or chills. No headache or dizziness. Has weakness. Denies any weight loss, weight gain.  EYES:  No double vision or blurry vision.  EARS, NOSE, THROAT:  No postnasal drip, slurred speech or dysphagia.  CARDIOVASCULAR:  No chest pain. Has palpitations. No orthopnea or nocturnal dyspnea, but has leg edema.  PULMONARY:  Positive for cough, shortness of breath, but denies any sputum, hemoptysis.  GASTROINTESTINAL: No abdominal pain, nausea, vomiting, or diarrhea. No melena or bloody stool.  GENITOURINARY:  No dysuria, hematuria, or incontinence.  SKIN:  No rash or jaundice.  NEUROLOGY:  No syncope, loss of consciousness or seizure.  HEMATOLOGY: No easy bruising or bleeding.  ENDOCRINE:  No polyuria, polydipsia, heat or cold intolerance.   VITAL SIGNS: Temperature 97.4, blood pressure 116/66, pulse was 138, 144 and now is 112, O2 saturation 100% on BiPAP.   PHYSICAL EXAMINATION: GENERAL: The patient is alert, awake, oriented, in no acute distress.  HEENT:  Pupils round, equal, react to light and  accommodation. Moist oral mucosa.  NECK: Supple. Positive for JVD. No thyromegaly, no lymphadenopathy.  CARDIOVASCULAR:  S1 and S2. Irregular rate and rhythm. No murmur, gallops.  PULMONARY:  Bilateral air entry. Bilateral rales. Mild use of accessory muscle to breathe.  ABDOMEN:  Soft, obese. Bowel sounds present. No tenderness or distention. No organomegaly.  EXTREMITIES: Bilateral leg edema, about 1+. No clubbing or cyanosis. No calf tenderness. Strong bilateral pedal pulses.  SKIN:  No rash or jaundice.  NEUROLOGY:   Alert and oriented x 3. No focal deficit. Power 5/5. Sensation intact.   LABORATORY DATA:  ABG showed pH 7.4, pCO2 of 41, pO2 of 93, with FiO2 of 50%.  Chest x-ray showed CHF and pulmonary edema. CK 77, CK-MB 3.5. Glucose 440, BUN 25, creatinine 1.16, sodium 135, potassium 4.7, chloride 99, bicarb 27. WBC 12.3, hemoglobin 11.4, platelets 375. Troponin 0.112. INR 1.20. EKG showed AFib with RVR at 134.   IMPRESSIONS: 1.  Acute respiratory failure.  2.  New onset atrial fibrillation with rapid ventricular response.  3.  Elevated troponin, possibly due to demanding ischemia secondary to acute respiratory failure, atrial fibrillation and congestive heart failure.  4.  Acute congestive heart failure.  5.  Pulmonary edema.  6.  Dehydration.  7.  Hypertension.  8.  Coronary artery disease.  9.  Diabetes.  10.  Anemia.   PLAN OF TREATMENT: 1.  The patient will be admitted to CCU. Will continue BiPAP. Start a Cardizem drip. Continue Coreg. Hold Norvasc. Start a heparin drip.  2.  We will start CHF protocol. Increase Lasix to 40 mg IV b.i.d. Will get echocardiogram and a Cardiology consult from Dr. Nehemiah Massed.  3.  We will follow up CBC, PTT, BMP, lipid panel, TSH.  4.  For diabetes, we will start sliding scale. Continue Levemir 50 units at bedtime subcu. Check hemoglobin A1c. Hold glipizide and metformin.  5.  Gastrointestinal prophylaxis.   I discussed the patient's critical condition and plan of treatment with the patient, the patient's daughter and son.   The patient's code status is DO NOT RESUSCITATE.   TIME SPENT:  About 66 minutes.    ____________________________ Demetrios Loll, MD qc:mr D: 12/26/2012 21:35:16 ET T: 12/26/2012 22:22:39 ET JOB#: [id number]  cc: Demetrios Loll, MD, <Dictator> Demetrios Loll MD ELECTRONICALLY SIGNED 12/27/2012 12:32

## 2014-07-23 NOTE — Consult Note (Signed)
Brief Consult Note: Diagnosis: Right ankle medial mallelous fracture.   Patient was seen by consultant.   Consult note dictated.   Comments: 81 y/o male with MMP with right non-displaced medial mallelous ankle fracture.  Not a surgical candidate. High risk for surgery and fracture is likely to heal acceptable with cast treatment alone.  Plan for non-operative treatment. NWB to RLE x 6 weeks. If fracture goes on to nonunion, will need ORIF, but a trial of nonop management is warranted. Pain control and placement per primary service. Follow-up [facility] in 1 week for re-eval and likely conversion to short leg cast.  Electronic Signatures: Mardene Sayer (MD)  (Signed 04-Oct-15 12:16)  Authored: Brief Consult Note   Last Updated: 04-Oct-15 12:16 by Mardene Sayer (MD)

## 2014-07-23 NOTE — Discharge Summary (Signed)
PATIENT NAME:  [name], CARDELL MR#:  [id number] DATE OF BIRTH:  Oct 12, 1926  DATE OF ADMISSION:  01/03/2014 DATE OF DISCHARGE:  01/06/2014  Admitted 01/03/2014 after observation stay of 2 days, from 01/01/2014.  DISCHARGE DIAGNOSES: 1.  Encephalopathy from medications.  2.  Ataxia secondary to medications and ankle fracture.  3.  Right ankle fracture.  4.  Chest pain, ruled out for myocardial infarction.  5.  Severe hypokalemia, replaced intravenously and p.o.  6.  Hypomagnesemia, replaced.  7.  Atrial fibrillation, heart rate controlled.  8.  Ischemic cardiomyopathy, stable on Lasix as noted in.   DISCHARGE MEDICATIONS: Per [facility] med reconciliation system. Please see for details. Lovenox should be for 2 weeks. Of note, he will need a Met-B in 1 week to follow up his hypokalemia.   HISTORY AND PHYSICAL: Please see detailed history and physical done on admission.   HOSPITAL COURSE: The patient was admitted after having multiple falls for 1 to 2 days following an ankle fracture that was diagnosed in the Emergency Room. He was admitted and could not bear weight, per orthopedic consults, so his ability to transfer and move in the room was extremely limited. He could not do this safely at home and his balance was still poor, likely related to the above. His troponins were negative regarding his chest pain. CT of the head initially was negative, which was prior to his admission. He slowly progressed as far as his mental status, although it did wax and wane with his opioid needs for his ankle pain. He had some constipation that did slowly improve. He will continue on the senna for that and p.r.n. MiraLax. His glucose was relatively well controlled, intending to track his dietary intake. He will stay on his usual home regimen. We will follow this closely. He will follow up soon with orthopedics to continue to care for his ankle fracture.   TIME SPENT: It took approximately 35 minutes to do all discharge  tasks today.  ____________________________ Ocie Cornfield. Ouida Sills, MD mwa:sb D: 01/06/2014 08:10:50 ET T: 01/06/2014 08:40:27 ET JOB#: [id number]  cc: Ocie Cornfield. Ouida Sills, MD, <Dictator> Kirk Ruths MD ELECTRONICALLY SIGNED 01/10/2014 7:34

## 2014-07-23 NOTE — Consult Note (Signed)
PATIENT NAME:  [name], [name] MR#:  [id number] DATE OF BIRTH:  09-10-1926  DATE OF CONSULTATION:  01/02/2014  CONSULTING PHYSICIAN:  Mardene Sayer, MD  REQUESTING PHYSICIAN: Frazier Richards, MD   HISTORY OF PRESENT ILLNESS: The patient is an 81 year old male, who walks with a walker and uses a wheelchair, who was shuffling in his home when he slipped over his sock, twisted his ankle and fell. He was brought to the emergency department and found to have a right ankle fracture. He was subsequently placed in a splint and allowed to go home. He returned the following day with shortness of breath and chest pain and found to have unstable angina. He is admitted to the hospital for management of his cardiac condition. I was asked to consult regarding his right ankle fracture. On my interview, the patient endorses pain in the right lower extremity and an inability to bear weight. He denies any injury elsewhere. He denies passing out at the time of injury.   PAST MEDICAL HISTORY:  1. He has an extensive cardiac history. For full details, please see his admission history and physical.  2. Type 2 diabetes.  3. Hypertension.   MEDICATIONS: Please see inpatient electronic medical record and outpatient notes.   SOCIAL HISTORY: The patient does not drink or smoke.   REVIEW OF SYSTEMS: Positive for recent chest pain, generalized musculoskeletal pain, otherwise negative.   PHYSICAL EXAM: GENERAL: The patient is a well-nourished, well-developed male. He is awake, alert, conversant. He is appropriate with no evidence of dementia.  VITAL SIGNS: Stable.  EXTREMITIES: Right lower extremity: Splint is in place. Toes are warm and well perfused. He does have pain with squeezing of the splint. The splint appears to be well fitting.   DIAGNOSTIC DATA: AP, lateral and oblique views of the right ankle demonstrate a nondisplaced medial malleolar fracture with no fibular fracture. The ankle joint is located. He does have  chronic changes around the ankle, consistent with mild arthritis.   PLAN: Nonweightbearing to left lower extremity for 6 weeks.   I think this is a fracture which will most likely heal with immobilization. Given his extensive cardiac history, he is a significant surgical risk and given the nondisplaced nature of the fracture,  analysis of the risks and benefits would tilt the scales towards nonoperative treatment.   Plan for the splint for 1 week. Follow up with the [facility] with Dr. Clydell Hakim group in 1 week for re-evaluation and likely conversion to a short-leg cast.   Time spent: 30 minutes on this consultation. CPT 77824   ____________________________ Mardene Sayer, MD pa:TT D: 01/02/2014 12:04:38 ET T: 01/02/2014 16:36:39 ET JOB#: [id number]  cc: Mardene Sayer, MD, <Dictator> Raelyn Ensign. Stephanie Acre, MD PhD Orthopaedic Surgery ELECTRONICALLY SIGNED 01/02/2014 20:16

## 2014-07-23 NOTE — Consult Note (Signed)
PATIENT NAME:  [name], [name] MR#:  [id number] DATE OF BIRTH:  09-10-1926  DATE OF CONSULTATION:  01/02/2014  REFERRING PHYSICIAN:  Fulton Reek, MD CONSULTING PHYSICIAN:  Camyla Camposano D. Clayborn Bigness, MD  PRIMARY CARE PHYSICIAN: Frazier Richards, MD  INDICATION: Chest pain, possible unstable angina.   HISTORY OF PRESENT ILLNESS: The patient is an 81 year old white male with history of coronary artery disease, coronary artery bypass surgery, ischemic cardiomyopathy, chronic systolic congestive heart failure, also has chronic atrial fibrillation, presented after having a fall and fracturing his ankle. He was seen by orthopedics, had a soft splint placed to help immobilize his ankle. He was found to be in atrial fibrillation, complained of some vague chest pain symptoms and now admitted for further evaluation by cardiology.   PAST MEDICAL HISTORY: Arteriosclerotic cardiovascular disease, ischemic cardiomyopathy, congestive heart failure with systolic dysfunction, chronic atrial fibrillation, reflux, hyperlipidemia, diabetes, hypertension, anemia.  PAST SURGICAL HISTORY: Coronary artery bypass surgery.   MEDICATIONS: Vitamin B12, vitamin D, Carafate 1 gram twice a day, Klor-Con 20 mEq a day, Protonix 40 twice a day, Norco 5/325 one every 6 hours, Lantus 30 units subcutaneous at bedtime, Metaglip 2.5/500 two tablets twice a day, Lasix 40 twice a day, Plavix 75 a day, Coreg 12.5 twice a day.   ALLERGIES: ASPIRIN AND STATIN.   SOCIAL HISTORY: Negative for alcohol and tobacco. Retired.   FAMILY HISTORY: Coronary artery disease, diabetes.  REVIEW OF SYSTEMS: Denies blackout spells or syncope. Denies nausea or vomiting. No fever. No chills. No sweats. No weight loss or weight gain. Denies hemoptysis or hematemesis. Denies bright red blood per rectum. No vision change or hearing change. Denies sputum production or cough. Recently had a fall injuring his ankle. Has had some vague chest pain symptoms and mild  shortness of breath, occasional palpitations. Known history of atrial fibrillation.   PHYSICAL EXAMINATION: VITAL SIGNS: Blood pressure 120/60, heart rate 100 and irregular, respiratory rate 14, temperature 98, sats 97 on room air. HEENT: Normocephalic, atraumatic. Pupils equal and reactive to light.  NECK: Supple. No significant JVD, bruits or adenopathy.  LUNGS: Clear to auscultation and percussion with mild rhonchi. No significant rales. Adequate air movement.  HEART: Irregularly irregular. Systolic ejection murmur at the apex. PMI nondisplaced. Positive   ABDOMEN: Benign. Positive bowel sounds. No rebound, guarding or tenderness.  EXTREMITIES: Within normal limits except  ankle with cast and splint. NEUROLOGIC: Intact. SKIN: Normal.  DIAGNOSTIC DATA: EKG: Atrial fibrillation, rate of about 110 with no acute ischemic changes.  Right ankle films revealed a nondisplaced right ankle fracture.   Urinalysis was unremarkable. White count 11.5, hemoglobin 12.9. Glucose 141, BUN 24, creatinine 1.06. Magnesium 1.3. Troponin 0.02.  ASSESSMENT: 1.  Chest pain. 2.  Possible angina.  3.  Rapid atrial fibrillation. 4.  Chronic systolic heart failure. 5.  Ischemic cardiomyopathy. 6.  Diabetes.  7.  Hypomagnesemia.  8.  Ankle fracture.  PLAN:  1.  Rule out for myocardial infarction with his chest pain symptoms. I am not sure whether it is reflux. Continue Plavix, continue short-term anticoagulation. The patient states he is allergic to aspirin. Follow-up telemetry. Follow up cardiac enzymes. Consider echocardiogram. At this stage recommend defer on cardiac catheterization unless chest pain continues. Consider nitroglycerin either paste or sublingual as needed.  2.  For ischemic cardiomyopathy, continue current medications with Coreg. Appears to be compensated at this point. Continue Lasix as well to help symptoms. Not on an ACE inhibitor at this point, but appears to be doing reasonably  well.  3.   For gastroesophageal reflux disease, continue Carafate, continue Protonix. I am not sure whether his  prophylaxis GI therapy.   4.  For diabetes, agree with Lantus and Metaglip. Follow fasting sugars. Using sliding scale when he is in the hospital. Follow a diabetic diet. 5.  Correct potassium and magnesium. 6.  Continue pain control for fractured ankle. Agree with orthopedic involvement. 7.  Atrial fibrillation. Continue rate control with Coreg. Consider adding an antiarrhythmic. Would also consider whether he is a reasonable candidate for long-term anticoagulation. He has been maintained on Plavix only. Will consider whether he can tolerate Coumadin or Eliquis.  8.  For his fracture, will consider rehab and physical therapy as an outpatient. No direct treatment for his atrial fibrillation, except rate control at this point. Do not recommend cardioversion at this stage.   ____________________________ Loran Senters Clayborn Bigness, MD ddc:sb D: 01/03/2014 08:30:53 ET T: 01/03/2014 09:58:49 ET JOB#: [id number]  cc: Keylin Podolsky D. Clayborn Bigness, MD, <Dictator> Yolonda Kida MD ELECTRONICALLY SIGNED 02/02/2014 10:35

## 2014-07-23 NOTE — Consult Note (Signed)
Chief Complaint:  Subjective/Chief Complaint Patient has dyspnea on okay mi palpitations no shortness of breath denies much in the way of chest pain still has some  ankle pain.   VITAL SIGNS/ANCILLARY NOTES: **Vital Signs.:   05-Oct-15 11:46  Vital Signs Type Routine  Temperature Temperature (F) 98.4  Celsius 36.8  Temperature Source oral  Pulse Pulse 117  Respirations Respirations 18  Systolic BP Systolic BP 527  Diastolic BP (mmHg) Diastolic BP (mmHg) 79  Mean BP 97  Pulse Ox % Pulse Ox % 95  Pulse Ox Activity Level  At rest  Oxygen Delivery Room Air/ 21 %  *Intake and Output.:   05-Oct-15 11:16  Grand Totals Intake:   Output:  0    Net:  0 24 Hr.:  140  Urine ml     Out:  0  Rehab Summary:   05-Oct-15 11:09  Rehab Progress Summary Rehab Progress Summary S: "I wish I could, but I am just not able to stand right now"  A: Patient needs encouragement to try standing, but simply is too weak and fearful to even attain fully upright.  He did lose control of his bowels on the second attempt with +2 assist and needs to be cleaned up.  p continue physical therapy plan of care for gait training, strength, ROM, balance and mobility  Gait Training Comments completely unable  Anticipated Discharge Disposition  SNF/STR   Brief Assessment:  GEN well developed, well nourished, no acute distress   Cardiac Irregular  murmur present  -- JVD   Respiratory normal resp effort  clear BS   Gastrointestinal Normal   Gastrointestinal details normal Soft  Nontender  Nondistended  No masses palpable   EXTR negative cyanosis/clubbing, negative edema, ankle in traction soft cast   Lab Results: Routine Chem:  05-Oct-15 05:26   Glucose, Serum 78  Creatinine (comp) 0.74  Sodium, Serum 140  Potassium, Serum  3.0  Chloride, Serum 106  CO2, Serum 28  Calcium (Total), Serum 8.5  Anion Gap  6  Osmolality (calc) 280  eGFR (African American) >60  eGFR (Non-African American) >60 (eGFR values  <53m/min/1.73 m2 may be an indication of chronic kidney disease (CKD). Calculated eGFR, using the MRDR Study equation, is useful in  patients with stable renal function. The eGFR calculation will not be reliable in acutely ill patients when serum creatinine is changing rapidly. It is not useful in patients on dialysis. The eGFR calculation may not be applicable to patients at the low and high extremes of body sizes, pregnant women, and vetetarians.)  Routine Hem:  05-Oct-15 05:26   WBC (CBC) 8.9  RBC (CBC)  4.08  Hemoglobin (CBC)  12.2  Hematocrit (CBC)  36.2  Platelet Count (CBC) 168  MCV 89  MCH 29.8  MCHC 33.7  RDW  14.9  Neutrophil % 72.8  Lymphocyte % 14.4  Monocyte % 10.3  Eosinophil % 1.9  Basophil % 0.6  Neutrophil # 6.5  Lymphocyte # 1.3  Monocyte # 0.9  Eosinophil # 0.2  Basophil # 0.1 (Result(s) reported on 03 Jan 2014 at 06:15AM.)   Radiology Results: XRay:    03-Oct-15 16:15, Chest Portable Single View  Chest Portable Single View   REASON FOR EXAM:    SOB  COMMENTS:       PROCEDURE: DXR - DXR PORTABLE CHEST SINGLE VIEW  - Jan 01 2014  4:15PM     CLINICAL DATA:  Shortness of breath.    EXAM:  PORTABLE  CHEST - 1 VIEW    COMPARISON:  11/23/2013 and 12/26/2012    FINDINGS:  Lungs are hypoinflated without consolidation or effusion. There is  mild stable cardiomegaly. Remainder the exam is unchanged.   IMPRESSION:  Hypoinflation without acute cardiopulmonary disease.      Electronically Signed    By: Marin Olp M.D.    On: 01/01/2014 16:37         Verified By: Pearletha Alfred, M.D.,  Cardiology:    02-Oct-15 21:52, ED ECG  Ventricular Rate 113  Atrial Rate 102  QRS Duration 90  QT 312  QTc 427  R Axis -52  T Axis 59  ECG interpretation   Atrial fibrillation with rapid ventricular response  Left axis deviation  Inferior infarct (cited on or before 03-May-2001)  Possible Anterolateral infarct (cited on or before  26-Dec-2012)  Abnormal ECG  When compared with ECG of 23-Nov-2013 21:11,  T wave inversion no longer evident in Inferior leads  ----------unconfirmed----------  Confirmed by OVERREAD, NOT (100), editor PEARSON, BARBARA ([id number]) on 01/03/2014 12:06:41 PM  ED ECG     03-Oct-15 12:10, ECG  Ventricular Rate 111  Atrial Rate 108  QRS Duration 98  QT 326  QTc 443  R Axis -81  T Axis 67  ECG interpretation   Atrial fibrillation with rapid ventricular response  Left axis deviation  Possible Anterior infarct (cited on or before 23-Nov-2013)  Abnormal ECG  When compared with ECG of 31-Dec-2013 21:52,  No significant change was found  ----------unconfirmed----------  Confirmed by OVERREAD, NOT (100), editor PEARSON, BARBARA ([id number]) on 01/03/2014 12:44:20 PM  ECG    Assessment/Plan:  Invasive Device Daily Assessment of Necessity:  Does the patient currently have any of the following indwelling devices? foley   Indwelling Urinary Catheter continued, requirement due to   Reason to continue Indwelling Urinary Catheter Immobilization due to pelvic/hip fracture or ortho procedure necessitating immobilization   Assessment/Plan:  Assessment IMP  atrial fibrillation  ankle fracture  hypertension   hypokalemia  congestive heart failure  ischemic cardiomyopathy  diabetes .   Plan PLAN  atrial fibrillation for rate control  agree with orthopedic evaluation for ankle fracture  hypertension control with Coreg  diuretics for heart failure  correct low potassium  pain control for ankle fracture  continue diabetes control  physical therapy occupational therapy  recommend rehab  consider long-term anticoagulation for AFib   Electronic Signatures: Lujean Amel D (MD)  (Signed 09-Oct-15 13:01)  Authored: Chief Complaint, VITAL SIGNS/ANCILLARY NOTES, Brief Assessment, Lab Results, Radiology Results, Assessment/Plan   Last Updated: 09-Oct-15 13:01 by Lujean Amel D (MD)

## 2014-07-23 NOTE — H&P (Signed)
PATIENT NAME:  [name], [name] MR#:  [id number] DATE OF BIRTH:  Feb 10, 1927  DATE OF ADMISSION:  01/01/2014  REFERRING PHYSICIAN: Tommi Rumps R. Karma Greaser, MD.   FAMILY PHYSICIAN: Ocie Cornfield. Ouida Sills, MD.   REASON FOR ADMISSION: Unstable angina.   HISTORY OF PRESENT ILLNESS: The patient is an 81 year old male with a history of coronary artery disease status post CABG with ischemic cardiomyopathy and chronic systolic congestive heart failure as well as chronic atrial fibrillation. Presented last evening, was diagnosed with an ankle fracture and was sent home. Now presents with chest pain and shortness of breath with minimal exertion. Unable to care for himself. In the emergency room, the patient was noted to be hypomagnesemic. His initial troponin was normal. He is now admitted for further evaluation.   PAST MEDICAL HISTORY: 1. ASCVD status post CABG.  2. Ischemic cardiomyopathy.  3. Chronic systolic congestive heart failure.  4. Chronic atrial fibrillation.  5. GE reflux disease.  6. Hyperlipidemia.  7. Type 2 diabetes.  8. Benign hypertension.  9. Chronic anemia.  10. Recent ankle fracture.   MEDICATIONS: 1. Vitamin B12, 1000 mcg p.o. daily.  2. Vitamin D3, 1000 international units p.o. daily.  3. Carafate 1 gram p.o. b.i.d.  4. Klor-Con 20 mEq p.o. daily.  5. Protonix 40 mg p.o. b.i.d.  6. Norco 5/325, 1 p.o. every 6 hours p.r.n. pain.  7. Lantus 30 units subcutaneous at bedtime.  8. Metaglip 2.5/500, 2 tablets b.i.d.  9. Lasix 40 mg p.o. b.i.d.  10. Plavix 75 mg p.o. daily.  11. Coreg 12.5 mg p.o. b.i.d.   ALLERGIES: ASPIRIN AND STATINS.   SOCIAL HISTORY: Negative for alcohol or tobacco abuse.   FAMILY HISTORY: Positive for coronary artery disease and diabetes.   REVIEW OF SYSTEMS:  CONSTITUTIONAL: No fever or change in weight.  EYES: No blurred or double vision. No glaucoma.  ENT: No tinnitus or hearing loss. No nasal discharge or bleeding. No difficulty swallowing.   RESPIRATORY: The patient has had cough, but no wheezing or hemoptysis. No painful respiration.  CARDIOVASCULAR: No orthopnea or palpitations.  GASTROINTESTINAL: No nausea, vomiting or diarrhea. No abdominal pain.  GENITOURINARY: No dysuria or hematuria. No incontinence.  ENDOCRINE: No polyuria or polydipsia. No heat or cold intolerance.  HEMATOLOGIC: The patient denies anemia, easy bruising or bleeding.  LYMPHATIC: No swollen glands.  MUSCULOSKELETAL: The patient has pain in his neck, back, shoulders, although he does have right ankle pain. No gout.  NEUROLOGIC: No numbness or migraines. Denies stroke or seizures.  PSYCHOLOGICAL: The patient denies anxiety, insomnia or depression.   PHYSICAL EXAMINATION: GENERAL: The patient is in no acute distress.  VITAL SIGNS: Currently remarkable for a blood pressure of 120/67, heart rate 105, which is irregular, respiratory rate of 18, temperature of 98.2, saturation 97% on room air.  HEENT: Normocephalic, atraumatic. Pupils equal, round, reactive to light and accommodation. Extraocular movements are intact. Sclerae are anicteric. Conjunctivae are clear. Oropharynx is clear.  NECK: Supple without JVD. No adenopathy or thyromegaly is noted.  LUNGS: Reveal basilar crackles without wheezes or rhonchi. No dullness. Respiratory effort is normal.  CARDIAC: Irregularly irregular rhythm. No significant rubs or gallops. PMI is nondisplaced. Chest wall is nontender.  ABDOMEN: Soft, nontender with normoactive bowel sounds. No organomegaly or masses were appreciated. No hernias or bruits were noted.  EXTREMITIES: Without clubbing, cyanosis or edema. Pulses were 2+ bilaterally.  SKIN: Warm and dry without rash or lesions.  NEUROLOGIC: Cranial nerves 2 through 12 grossly intact. Deep tendon  reflexes were symmetric. Motor and sensory exam is nonfocal.  PSYCHIATRIC: Revealed a patient who is alert and oriented to person, place and time. He was cooperative and used good  judgment.   DIAGNOSTIC DATA: EKG revealed atrial fibrillation at 111 beats per minute with no acute ischemic changes. Head CT was unremarkable. Right ankle films revealed a nondisplaced right ankle fracture.   LABORATORY DATA: Urinalysis was unremarkable. White count was 11.5 with a hemoglobin of 12.9. Glucose 141 with a BUN of 24, creatinine 1.06 with a magnesium of 1.3 and a troponin of 0.02.   ASSESSMENT: 1. Chest pain, worrisome for unstable angina.  2. Rapid atrial fibrillation.  3. Chronic systolic congestive heart failure.  4. Acute right ankle fracture.  5. Hypomagnesemia.  6. Ischemic cardiomyopathy.  7. Type 2 diabetes.   PLAN: The patient will be observed on orthopedics with telemetry. We will follow serial cardiac enzymes. We will supplement his potassium. We will consult orthopedics for his right ankle fracture and cardiology because of his unstable angina and atrial fibrillation. Will obtain a physical therapy evaluation. We will ask care management to see the patient for possible discharge into an assisted living facility. We will follow his sugars with Accu-Cheks before meals and at bedtime and add sliding scale insulin as needed. Follow up routine labs in the morning. Further treatment and evaluation will depend upon the patient's progress.   TOTAL TIME SPENT ON THIS PATIENT: 45 minutes.       ____________________________ Leonie Douglas Doy Hutching, MD jds:TT D: 01/01/2014 15:58:39 ET T: 01/01/2014 16:58:28 ET JOB#: [id number]  cc: Leonie Douglas. Doy Hutching, MD, <Dictator> Ocie Cornfield. Ouida Sills, MD Analaura Messler Lennice Sites MD ELECTRONICALLY SIGNED 01/01/2014 17:55

## 2014-07-23 NOTE — Consult Note (Signed)
Chief Complaint:  Subjective/Chief Complaint ankle pain is much better. she denies any chest pain or shortness of breath still has some mi palpitations.   VITAL SIGNS/ANCILLARY NOTES: **Vital Signs.:   06-Oct-15 11:42  Vital Signs Type Routine  Temperature Temperature (F) 97.9  Celsius 36.6  Pulse Pulse 101  Respirations Respirations 20  Systolic BP Systolic BP 888  Diastolic BP (mmHg) Diastolic BP (mmHg) 65  Mean BP 89  Pulse Ox % Pulse Ox % 92  Pulse Ox Activity Level  At rest  Oxygen Delivery Room Air/ 21 %  *Intake and Output.:   06-Oct-15 11:15  Grand Totals Intake:  120 Output:      Net:  120 24 Hr.:  860  Oral Intake      In:  120  Unmeasured Intake  Few bites  Urinary Method  Void; Incontinent   Brief Assessment:  GEN well developed, well nourished, no acute distress   Cardiac Irregular   Respiratory normal resp effort  clear BS   Gastrointestinal Normal   Gastrointestinal details normal Soft  Nontender  Nondistended  No masses palpable   EXTR negative cyanosis/clubbing, negative edema   Lab Results: Routine Chem:  06-Oct-15 04:34   Glucose, Serum  128  BUN 12  Creatinine (comp) 0.87  Potassium, Serum  3.1  Chloride, Serum 106  CO2, Serum 28  Calcium (Total), Serum  8.2  Anion Gap  4 (Result(s) reported on 04 Jan 2014 at 05:23AM.)  Osmolality (calc) 277  Routine Hem:  06-Oct-15 04:34   WBC (CBC) 9.3  RBC (CBC)  3.96  Hemoglobin (CBC)  11.6  Hematocrit (CBC)  35.7  Platelet Count (CBC) 164  MCV 90  MCH 29.2  MCHC 32.5  RDW 14.5  Neutrophil % 76.3  Lymphocyte % 12.0  Monocyte % 9.6  Eosinophil % 1.5  Basophil % 0.6  Neutrophil #  7.1  Lymphocyte # 1.1  Monocyte # 0.9  Eosinophil # 0.1  Basophil # 0.1 (Result(s) reported on 04 Jan 2014 at 05:23AM.)   Radiology Results: XRay:    03-Oct-15 16:15, Chest Portable Single View  Chest Portable Single View   REASON FOR EXAM:    SOB  COMMENTS:       PROCEDURE: DXR - DXR PORTABLE CHEST  SINGLE VIEW  - Jan 01 2014  4:15PM     CLINICAL DATA:  Shortness of breath.    EXAM:  PORTABLE CHEST - 1 VIEW    COMPARISON:  11/23/2013 and 12/26/2012    FINDINGS:  Lungs are hypoinflated without consolidation or effusion. There is  mild stable cardiomegaly. Remainder the exam is unchanged.   IMPRESSION:  Hypoinflation without acute cardiopulmonary disease.      Electronically Signed    By: Marin Olp M.D.    On: 01/01/2014 16:37         Verified By: Pearletha Alfred, M.D.,  Cardiology:    02-Oct-15 21:52, ED ECG  Ventricular Rate 113  Atrial Rate 102  QRS Duration 90  QT 312  QTc 427  R Axis -52  T Axis 59  ECG interpretation   Atrial fibrillation with rapid ventricular response  Left axis deviation  Inferior infarct (cited on or before 03-May-2001)  Possible Anterolateral infarct (cited on or before 26-Dec-2012)  Abnormal ECG  When compared with ECG of 23-Nov-2013 21:11,  T wave inversion no longer evident in Inferior leads  ----------unconfirmed----------  Confirmed by OVERREAD, NOT (100), editor PEARSON, BARBARA ([id number]) on 01/03/2014 12:06:41 PM  ED ECG     03-Oct-15 12:10, ECG  Ventricular Rate 111  Atrial Rate 108  QRS Duration 98  QT 326  QTc 443  R Axis -25  T Axis 67  ECG interpretation   Atrial fibrillation with rapid ventricular response  Left axis deviation  Possible Anterior infarct (cited on or before 23-Nov-2013)  Abnormal ECG  When compared with ECG of 31-Dec-2013 21:52,  No significant change was found  ----------unconfirmed----------  Confirmed by OVERREAD, NOT (100), editor PEARSON, BARBARA (32) on 01/03/2014 12:44:20 PM  ECG    Assessment/Plan:  Assessment/Plan:  Assessment IMP  ankle   fracture  recent fall  atrial fibrillation  and diabetes  cardiomyopathy  hypertension  hypokalemia .   Plan PLAN  rate control for atrial fibrillation  consider long-term anticoagulation  diabetes control  continue hypertension control   physical therapy  correct hypokalemia  placement for physical therapy and rehab   Electronic Signatures: Yolonda Kida (MD)  (Signed 09-Oct-15 13:10)  Authored: Chief Complaint, VITAL SIGNS/ANCILLARY NOTES, Brief Assessment, Lab Results, Radiology Results, Assessment/Plan   Last Updated: 09-Oct-15 13:10 by Lujean Amel D (MD)

## 2014-07-23 NOTE — Consult Note (Signed)
Brief Consult Note: Diagnosis: AFIB/Fx foot/CP.   Patient was seen by consultant.   Consult note dictated.   Recommend to proceed with surgery or procedure.   Orders entered.   Discussed with Attending MD.   Comments: IMP Fx ankle AFIB CP HTN DM Hx CHF ICM . PLAN Pain control for ankle Rate control for AFIB Anticoug Bp control DM control PT/OT Lasix for CHF Rehab.  Electronic Signatures: Lujean Amel D (MD)  (Signed 05-Oct-15 07:11)  Authored: Brief Consult Note   Last Updated: 05-Oct-15 07:11 by Yolonda Kida (MD)

## 2014-07-31 NOTE — Discharge Summary (Signed)
PATIENT NAME:  [name], [name] MR#:  [id number] DATE OF BIRTH:  03-16-1927  DATE OF ADMISSION:  06/20/2014 DATE OF DISCHARGE:  06/23/2014  ADMITTING DIAGNOSIS: Acute respiratory failure.  DISCHARGE DIAGNOSES: 1.  Acute respiratory failure with hypoxia.  2.  Acute systolic congestive heart failure.  3.  Aspiration pneumonia to right lower lobe.  4.  Dysphagia.  5.  Generalized weakness.  6.  Hypertension.  7.  Acute renal failure.  8.  History of cardiomyopathy with ejection fraction of 35%, history of  chronic atrial fibrillation, ischemic cardiomyopathy, gastroesophageal reflux disease, hyperlipidemia, diabetes mellitus, hypertension, chronic anemia, ankle fracture, status post coronary artery bypass grafting.   DISCHARGE CONDITION: Poor.   DISPOSITION: The patient is being discharged to hospice home.   MEDICATIONS:  1.  Santyl ointment topically to affected area once daily.  2.  Ativan 0.5 mg sublingually orally every 4 hours as needed.  3.  Motrin 200 mg/1 mL oral concentrate 2.5 mL every 2 hours as needed.  4.  Albuterol ipratropium 2.5/0.5 mg/3 mL inhalation solution 1 inhalation 6 times daily as needed.   The patient is not to take glipizide/metformin, Coreg, sucralfate, Plavix, pantoprazole, furosemide, potassium, PreserVision, multivitamins, Lantus, or vitamin B12.    FOLLOWUP APPOINTMENTS: No followup appointments were recommended.   CONSULTANTS: Mr. Altha Harm, Dr. Nehemiah Massed.   RADIOLOGIC STUDIES: Chest x-ray, portable single view, 06/20/2014, revealing cardiomegaly and mild interstitial edema. Repeat chest x-ray, portable single view, 06/21/2014, revealed poor inspiratory effort with right basilar infiltrate. Chest x-ray, portable single view, 06/23/2014, revealed no change from prior study 2 days ago, cardiomegaly and pulmonary vascular congestion, shallow inspiration with bibasilar atelectasis or pneumonia. Echocardiogram, 06/21/2014, revealing left ventricular  ejection fraction by visual estimation 45%-50%, normal global left ventricular systolic function, moderately dilated left atrium as well as mildly dilated right atrium, moderate mitral valve regurgitation, moderate tricuspid regurgitation, mildly elevated pulmonary arterial systolic pressures, mildly increased left ventricular posterior wall thickness.   HOSPITAL COURSE: The patient is an 81 year old Caucasian male with past medical history significant for history of multiple medical problems who presented to the hospital on 06/20/2014 with complaints of shortness of breath. Please refer to Dr. Lianne Moris admission note of 06/20/2014. On arrival to the hospital, the patient was hypoxic with oxygen saturation in the 70s-80s. The patient was admitted to have lower extremity swelling. The patient's vital signs on arrival to the Emergency Room, temperature was 97.7, pulse was 111, respiration rate was 20, blood pressure 143/80, saturation was 96% on oxygen therapy. Physical exam revealed bilateral weak breath sounds, a few rales, but no use of accessory muscles. The patient did have lower extremity edema bilaterally. The patient's labs done on arrival to the hospital, 06/20/2014, revealed beta-type natriuretic peptide of 144, BUN and creatinine were 62 and 1.58 respectively. The patient's glucose level was 199. The patient's cardiac enzymes x 3 were within normal limits. CBC: White blood cell count was 10.1, hemoglobin was 8.6, platelet count was 244,000. EKG showed atrial fibrillation at a rate 116, left axis deviation, septal infarct which was already cited in August 2015, and nonspecific ST-T changes, questionable lateral subendocardial injury. The patient was admitted to the hospital for further evaluation. He was attempted to diurese, unfortunately his kidney function worsened and his creatinine rose up to a 2.51 by 06/23/2014. He also aspirated with progressively worsening shortness of breath requiring 5 L of oxygen  through nasal cannula. The patient's family discussed the patient's case with palliative care and made decisions about  to hospice home placement, where patient is going to be discharged today, 06/23/2014. On the day of discharge, temperature was 96.7, pulse was in 90s-102, respiration rate was 20, blood pressure 102/63, saturation was 94%-97% on 4-5 L of oxygen through nasal cannula at rest.   TIME SPENT: 40 minutes.    ____________________________ Theodoro Grist, MD rv:bm D: 06/23/2014 18:16:11 ET T: 06/24/2014 06:20:10 ET JOB#: [id number]  cc: Theodoro Grist, MD, <Dictator> Kijana Estock MD ELECTRONICALLY SIGNED 07/10/2014 14:36

## 2014-07-31 NOTE — Consult Note (Signed)
   Comments   Follow up visit made. Pt is increasingly lethargic this afternoon. He is also transiently febrile. He is being tx'd for possible PNA. All of pt's children (two sons and daughter) have talked about code status and would like to change to DNR. Family are talking about possible transition to the Hospice Home should patient decline or not improve.   Electronic Signatures for Addendum Section:  Phifer, Izora Gala (MD) (Signed Addendum 23-Mar-16 16:51)  I reviewed pt's records, examined pt with Billey Chang, NP, and discussed with him in detail. Agree with assessment and plan as outlined in above note. Pt febrile, less responsive. If does not improve by AM, would be appropriate for the Hospice Home. Family have discussed code status and pt is DNR. Order entered.   Electronic Signatures: Kamilla Hands, Kirt Boys (NP)  (Signed 23-Mar-16 16:33)  Authored: Palliative Care   Last Updated: 23-Mar-16 16:51 by Phifer, Izora Gala (MD)

## 2014-07-31 NOTE — H&P (Signed)
PATIENT NAME:  [name], [name] MR#:  [id number] DATE OF BIRTH:  1926-05-06  DATE OF ADMISSION:  06/20/2014  PRIMARY CARE PHYSICIAN: Dr. Ouida Sills.   REFERRING PHYSICIAN:  Dr. Reita Cliche.   CHIEF COMPLAINT: Shortness of breath today.   HISTORY OF PRESENT ILLNESS: An 81 year old Caucasian male with a history of CHF, chronic atrial fibrillation, atrial was sent from home due to shortness of breath today. The patient is lethargic and confused, unable to provide any information. According to the patient's son and daughter the patient had altered mental status last Saturday and was found hypoxia with O2 saturation at 80s.  The patient started to have shortness of breath today. Home health nurse checked O2 saturation which was at 78-80%. The patient was sent to ED for further evaluation. According to the patient's daughter the patient has worsening leg edema recently.   PAST MEDICAL HISTORY:  1. CHF, ejection fraction 35%.  2. Chronic atrial fibrillation.    3. Ischemic cardiomyopathy.  4. GERD.   5. Hyperlipidemia.  6. Diabetes.  7. Hypertension.  8. Chronic anemia.  9. Recent ankle fracture.   PAST SURGICAL HISTORY: CABG.   SOCIAL HISTORY: No drinking or illicit drugs.  No tobacco abuse.   FAMILY HISTORY: CAD and diabetes.   ALLERGIES: ASPIRIN AND STATINS.   HOME MEDICATIONS:  1. Vitamin B12, 1000 mcg per mL injectable solution, 1 mL injectable once a month. 2. Sucralfate 1 gram p.o. b.i.d.    3. Santyl 250 units applied topically to affected area once a day.   4. PreserVision AREDS 2 antioxidant multivitamins and minerals oral capsule 1 cap twice a day.  5. Potassium 20 mEq once a day p.o.  6. Pantoprazole 40 mg p.o. daily.  7. Lantus 30 units subcutaneous once a day.  8. Glipizide-metformin 2.5 mg-500 mg p.o. 2 tablets b.i.d.    9. Lasix 40 mg p.o. b.i.d.  10. Plavix 75 mg p.o. daily.  11. Coreg 12.5 mg p.o. b.i.d.   REVIEW OF SYSTEMS:  The patient is lethargic and confused, unable  to obtain at this time.   PHYSICAL EXAMINATION:  VITAL SIGNS: Temperature 97.7, blood pressure 143/80, pulse 111, O2 saturation 96% on oxygen.  GENERAL: The patient is lethargic and confused, unable to communicate.   HEENT: Pupils round, equal, react to light. No discharge from ear or nose. Dry oral mucosa. NECK:  Supple.  No JVD or carotid bruit. No lymphadenopathy. No thyromegaly.  CARDIOVASCULAR: S1, S2. Irregular rate and rhythm. No murmurs or gallops.  PULMONARY: Bilateral air entry. Bilateral weak breath sounds.  Mild rales. No use of accessory muscle to breathe.  ABDOMEN: Soft. Bowel sounds present. Distended. No tenderness. Difficult to estimate whether the patient has organomegaly due to abdominal distention.  EXTREMITIES: Bilateral lower extremity edema 1 + on the left lower extremity and 2 + on the right lower extremity. No clubbing or cyanosis. Bilateral pedal pulses present.  SKIN: No rash or jaundice.  NEUROLOGY: The patient is confused and lethargic, unable to exam.   LABORATORY DATA:  Chest x-ray showed cardiomegaly with mild interstitial edema. Glucose 199, BUN 62, creatinine 1.58.  Electrolytes normal. Troponin less than 0.03. WBC 10.1, hemoglobin 8.6, platelets 244,000. EKG showed atrial fibrillation with RVR at 116 BPM.   IMPRESSION:  1. Acute on chronic systolic congestive heart failure.  2. Atrial fibrillation with rapid ventricular response.  3. Acute metabolic encephalopathy, possibly due to hypoxia.  4. Acute respiratory failure with hypoxia due to congestive heart failure.  5.  Acute renal failure.  6. Chronic anemia.   PLAN OF TREATMENT:  1.  The patient will be admitted to telemetry floor. We will continue telemonitor.  Start Lasix 40 mg IV q. 12 hours. In addition I will start CHF protocol, get a cardiology consult from [facility] cardiologist.   2.  For atrial fibrillation with RVR, I will continue Coreg and Plavix. The patient is not a candidate for anticoagulation.  Follow up with cardiologist for further recommendation. 3.  For diabetes we will start sliding scale, continue Lantus, but hold glipizide-metformin due to acute renal failure.  4.  For acute renal failure, as mentioned above, l hold metformin and follow up BMP. 5.  For acute metabolite encephalopathy, start aspiration and fall precaution. 6.  I discussed the patient's condition and plan of treatment with the patient's son and daughter, they want the patient on full code for now.   TIME SPENT: About 65 minutes.    ____________________________ Demetrios Loll, MD qc:bu D: 06/20/2014 19:45:36 ET T: 06/20/2014 20:09:55 ET JOB#: [id number]  cc: Demetrios Loll, MD, <Dictator> Demetrios Loll MD ELECTRONICALLY SIGNED 06/20/2014 20:57

## 2014-07-31 NOTE — Consult Note (Signed)
PATIENT NAME:  [name], [name] MR#:  [id number] DATE OF BIRTH:  December 23, 1926  DATE OF CONSULTATION:  06/21/2014  REFERRING PHYSICIAN:  Theodoro Grist, MD CONSULTING PHYSICIAN:  Corey Skains, MD  REASON FOR CONSULTATION: Acute/chronic systolic dysfunction heart failure, atrial fibrillation, rapid ventricular.   CHIEF COMPLAINT: "I got short of breath."   HISTORY OF PRESENT ILLNESS: This is an 81 year old male with known coronary artery disease status post coronary artery bypass graft with previous myocardial infarction who has had significant progression of lower extremity edema with known pulmonary hypertension and recent diagnosis of atrial fibrillation, which is now chronic. The patient has had some rapid ventricular rate of atrial fibrillation for which she had had a recent increase in beta blocker. The beta blocker, therefore, caused some hypotension and then was discontinued. He did have some more rapid ventricular rate at this time causing shortness of breath and systolic dysfunction and congestive heart failure, but normal troponin without evidence of myocardial ischemia and/or infarct. He does have chronic kidney disease stage III and anemia with a hemoglobin of 8.6, most likely exacerbating above. Currently, his heart rate is 110 beats per minute and may need further treatment. The patient does have some improvements with intravenous Lasix with less pulmonary edema and lower extremity edema and less hypoxia. The patient currently is feeling better.   REVIEW OF SYSTEMS: Review of systems not assessed due to the patient's difficulty to communicate.   PAST MEDICAL HISTORY:  1.  Chronic systolic dysfunction heart failure.  2.  Chronic nonvalvular atrial fibrillation.  3.  Chronic kidney disease stage III.  4.  Anemia.  5.  Diabetes with complication.  6.  Coronary artery disease status post coronary artery bypass.   FAMILY HISTORY: The patient has multiple family members with  hypertension and hyperlipidemia, but no heart failure.   SOCIAL HISTORY: Currently denies alcohol or tobacco use.   ALLERGIES: As listed.   MEDICATIONS: As listed.   PHYSICAL EXAMINATION:  VITAL SIGNS: Blood pressure is 105/68 bilaterally. Heart rate is 105 upright, reclining, and irregular.  GENERAL: He is a well-appearing elderly male in no acute distress.  HEAD, EYES, EARS, NOSE, THROAT: No icterus, thyromegaly, ulcers, hemorrhage, or xanthelasma.  CARDIOVASCULAR: Irregularly irregular with normal S1 and S2. Inferiorly PMI with apical murmur consistent with mitral regurgitation. Carotid upstroke normal with bilateral bruit. Jugular venous pressure is not seen.  LUNGS: Have bibasilar crackles with decreased breath sounds.  ABDOMEN: Soft, nontender with some hepatosplenomegaly, but no masses.  EXTREMITIES: 2+ radial, trace femoral and dorsal pedal pulses, with 1+ lower extremity edema. No ulcers, but the patient does have some brawny changes.  NEUROLOGIC: He is oriented to time, place, and person, with normal mood and affect.   ASSESSMENT: An 81 year old male with acute/chronic systolic dysfunction, congestive heart failure, likely secondary to atrial fibrillation, nonvalvular, with rapid ventricular rate and exacerbated by chronic kidney disease stage III, diabetes with complication and anemia.  RECOMMENDATIONS:  1.  Change, carvedilol to metoprolol for better heart rate control of atrial fibrillation and systolic dysfunction, congestive heart failure.  2.  No anticoagulation at this point for stroke risk with atrial fibrillation due to anemia and history of bleeding, as well as fall risk.  3.  No further intervention of shortness of breath with no evidence of significant myocardial infarction at this time.  4.  Continue to watch closely for worsening anemia, which may exacerbate above.  5.  Lasix for lower extremity edema and pulmonary edema which has  recently been exacerbated by above,  and watch closely for chronic kidney disease stage III.  6.  Further ambulation and adjustments of medications thereafter, and also would consider some palliative care so that the patient would get better long-term treatments at home health.  ____________________________ Corey Skains, MD bjk:am D: 06/21/2014 22:07:02 ET T: 06/22/2014 01:54:15 ET JOB#: [id number]  cc: Corey Skains, MD, <Dictator> Corey Skains MD ELECTRONICALLY SIGNED 06/28/2014 8:09

## 2014-07-31 NOTE — Op Note (Signed)
PATIENT NAME:  [name], [name] MR#:  [id number] DATE OF BIRTH:  [birth year]  DATE OF PROCEDURE:  04/26/2014  PREOPERATIVE DIAGNOSIS: Atherosclerotic occlusive disease, bilateral lower extremities, with ulcerations of the right lower extremity.   POSTOPERATIVE DIAGNOSIS: Atherosclerotic occlusive disease, bilateral lower extremities, with ulcerations of the right lower extremity.     PROCEDURES PERFORMED:  1.  Abdominal aortogram.  2.  Right lower extremity distal runoff. 3.  Order catheter placement.  4.  Percutaneous transluminal angioplasty to 3 mm right posterior tibial artery.  5.  Percutaneous transluminal angioplasty to 5 mm, right superficial femoral artery.  6.  Percutaneous transluminal angioplasty and stent placement, right external iliac artery to 7 mm.   SURGEON: Katha Cabal, MD   SEDATION:  Versed plus fentanyl. Continuous ECG, pulse oximetry and cardiopulmonary monitoring was performed throughout the entire procedure by the interventional radiology nurse. Total sedation time was 1 hour 20 minutes.   ACCESS: A 6 French sheath, left common femoral artery.   FLUOROSCOPY TIME: 11.5 minutes.   CONTRAST USED: Isovue 95 mL.   INDICATIONS:  [name] is an 81 year old gentleman who presented to the office with ulcerations of the right distal lower extremity, noninvasive studies as well as physical examination were consistent with severe atherosclerotic occlusive disease and he is scheduled for angiography with the hope for intervention for limb salvage. The risks and benefits are reviewed. All questions answered. The patient agrees to proceed.   DESCRIPTION OF PROCEDURE: The patient is taken to the special procedure suite, placed in the supine position.  After adequate sedation is achieved, left groin is prepped and draped in sterile fashion. Ultrasound is placed in a sterile sleeve. Ultrasound is utilized second to the lack of appropriate landmarks and to avoid vascular  injury.  Under direct ultrasound visualization, common femoral artery is identified.  It is echolucent and pulsatile indicating patency.  Image is recorded for the permanent record, and a micropuncture needle was used to access the common femoral artery, microwire followed by micro-sheath, J-wire followed by a 5 French sheath and 5 French pigtail catheter.   The pigtail catheter is positioned at the level of T12 and AP projection of the aorta is obtained. Pigtail catheter is repositioned to above the bifurcation and bilateral oblique views of the pelvis are obtained. Using a rim catheter, and stiff angled Glidewire, the aortic bifurcation is crossed with the catheter positioned in the distal external iliac and an RAO projection of the right groin is obtained. Subsequently, the wire is reintroduced and negotiated into the SFA.  Rim catheter is exchanged for the pigtail catheter and distal runoff is obtained.  Stiff angled Glidewire is then reintroduced and a pigtail catheter is exchanged for a straight slip catheter, which is negotiated down across the tibioperoneal trunk and into the posterior tibial where hand injection demonstrates that the distal posterior tibial lateral plantar and pedal arch are patent. Versacore wires were reintroduced.  4000 units of heparin is given and an [place] 6 [place] sheath is advanced up and over the bifurcation under fluoroscopic guidance. A 4 x 15 Lutonix balloon is then advanced into the posterior tibial proximally at 3 cm.  It is inflated to just 4 to 5 atmospheres for several minutes.  This fully expands the lesions within the posterior tibial and injection of contrast performed after this inflation to demonstrate adequate angioplasty results.   A 5 x 15 Lutonix balloon is then advanced down to the mid popliteal, 2 more Lutonix balloons are  used in serial fashion.  After this one, all inflations are to 8 to 12 atmospheres for three minutes.  Follow-up imaging demonstrates an  excellent result with rapid flow contrast through the SFA. No evidence of flow-limiting dissection. Attention is then turned to the iliacs where magnified imaging is obtained. This demonstrates that there are 2 lesions within the external iliac; 1 is just above the ilioinguinal ligament and this is treated with a 6 x 4 Lutonix.  Inflation is to 12 atmospheres for several minutes. The more proximal lesion, a 7 x 60 LifeStar stent is deployed and then post dilated with a 7 x 4 balloon. Follow-up imaging now demonstrates that the common iliac is widely patent. It is free of hemodynamically significant lesions. There is some distal aortic plaque present and some mild to moderate narrowing at the origin of the iliac but in general, the iliac itself is widely patent. External iliac is now widely patent. Stents is well-approximated and has totally resolved the lesions.  Distally, the lesions are adequately treated with angioplasty.   The left external, oblique view is obtained, and a Mynx device is deployed with good result.   INTERPRETATION: The abdominal aorta demonstrates diffuse atherosclerotic changes. There is some moderate narrowing distally. The right common iliac, except for the origin, otherwise patent.  External iliac demonstrates 2 separate but hemodynamically significant lesions. Distal lesion is treated with angioplasty alone.  The proximal lesion is treated with angioplasty and stent placement.  Common femoral on the right is patent as is the profunda. SFA demonstrates diffuse disease with multiple subtotal occlusions along its way.  Popliteal is patent.  Anterior tibial and peroneal are essentially occluded.  Posterior tibial is the dominant runoff to the foot but demonstrates critical stenosis in its proximal few centimeters.  Following angioplasty, as described above, there is resolution of the lesions in the posterior tibial and tibioperoneal trunk. The SFA is well treated with angioplasty alone  using Lutonix balloons, and the external iliac is treated with angioplasty distally and stent placement proximally, again with excellent result.  Of note, on the left, there is a greater than 70% narrowing at the origin of the left common iliac. There is also a lesion within the left external iliac of greater than 70%, profunda femoris and common femoral appear relatively spared of hemodynamically significant stenoses. Likely in the future, the patient will need kissing balloons to treat the iliac stenosis on the left, as well as a moderate aortic plaque on the right.       ____________________________ Katha Cabal, MD ggs:DT D: 04/26/2014 13:45:03 ET T: 04/26/2014 15:15:47 ET JOB#: [id number]  cc: Katha Cabal, MD, <Dictator> Ocie Cornfield. Ouida Sills, [place] MD ELECTRONICALLY SIGNED 05/04/2014 17:43

## 2014-11-02 LAB — CBC WITH DIFFERENTIAL/PLATELET
BASOS ABS: 0.1 10*3/uL (ref 0.0–0.1)
BASOS PCT: 0.7 %
Eosinophil #: 0 10*3/uL (ref 0.0–0.7)
Eosinophil %: 0.3 %
HCT: 27.5 % — AB (ref 40.0–52.0)
HGB: 8.2 g/dL — ABNORMAL LOW (ref 13.0–18.0)
LYMPHS ABS: 1.3 10*3/uL (ref 1.0–3.6)
LYMPHS PCT: 12.4 %
MCH: 25.8 pg — ABNORMAL LOW (ref 26.0–34.0)
MCHC: 29.9 g/dL — AB (ref 32.0–36.0)
MCV: 86 fL (ref 80–100)
MONOS PCT: 10.1 %
Monocyte #: 1.1 x10 3/mm — ABNORMAL HIGH (ref 0.2–1.0)
Neutrophil #: 8 10*3/uL — ABNORMAL HIGH (ref 1.4–6.5)
Neutrophil %: 76.5 %
Platelet: 287 10*3/uL (ref 150–440)
RBC: 3.18 10*6/uL — AB (ref 4.40–5.90)
RDW: 15.3 % — ABNORMAL HIGH (ref 11.5–14.5)
WBC: 10.4 10*3/uL (ref 3.8–10.6)

## 2014-11-02 LAB — BASIC METABOLIC PANEL
Anion Gap: 9 (ref 7–16)
BUN: 70 mg/dL — ABNORMAL HIGH
CHLORIDE: 102 mmol/L
Calcium, Total: 8.8 mg/dL — ABNORMAL LOW
Co2: 28 mmol/L
Creatinine: 2.01 mg/dL — ABNORMAL HIGH
EGFR (African American): 34 — ABNORMAL LOW
EGFR (Non-African Amer.): 29 — ABNORMAL LOW
GLUCOSE: 123 mg/dL — AB
Potassium: 4 mmol/L
SODIUM: 139 mmol/L

## 2014-11-02 LAB — DIGOXIN LEVEL: DIGOXIN: 0.8 ng/mL

## 2014-12-07 IMAGING — CT CT HEAD WITHOUT CONTRAST
2 series · 14 of 30 positions shown, 16 images · non-contrast
Comparison: None.

CLINICAL DATA: Fell in bathroom striking back of head on right, on
aspirin, history MI, atrial fibrillation

EXAM:
CT HEAD WITHOUT CONTRAST
TECHNIQUE: Contiguous axial images were obtained from the base of the skull
through the vertex without intravenous contrast.

[Series 2: head wo · axial · 0.46mm/px · z∈[+522,+648]mm · 6 of 40 slices shown, 8 images]
[im 6/40  brain]
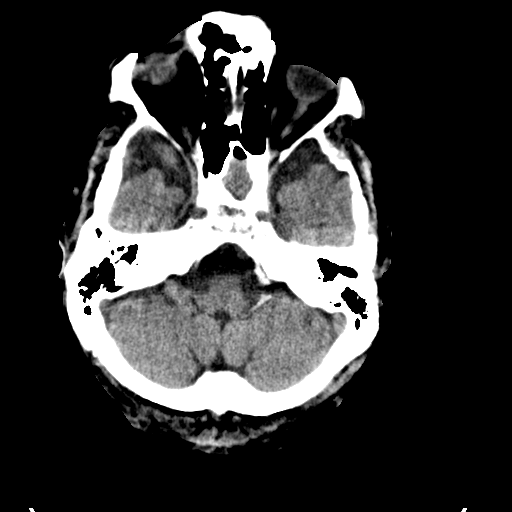
[im 6/40  bone]
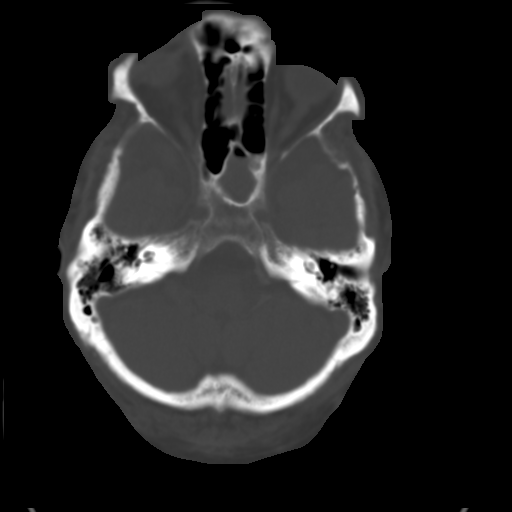
[im 12/40  brain]
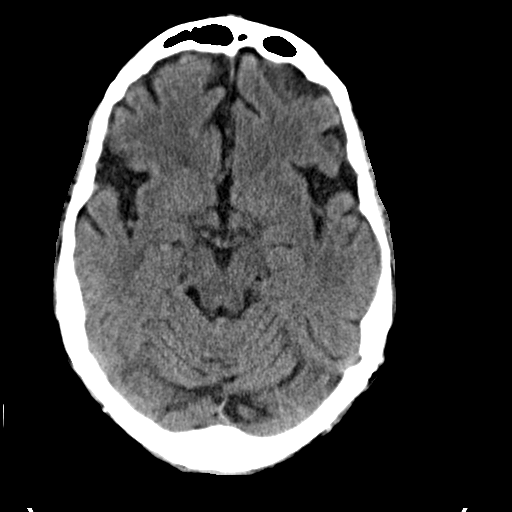
[im 17/40  brain]
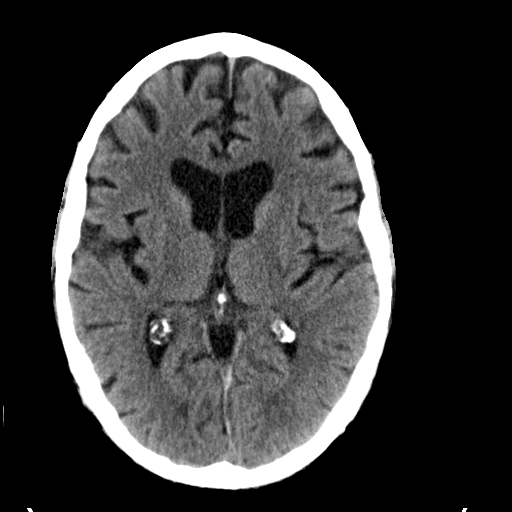
[im 23/40  brain]
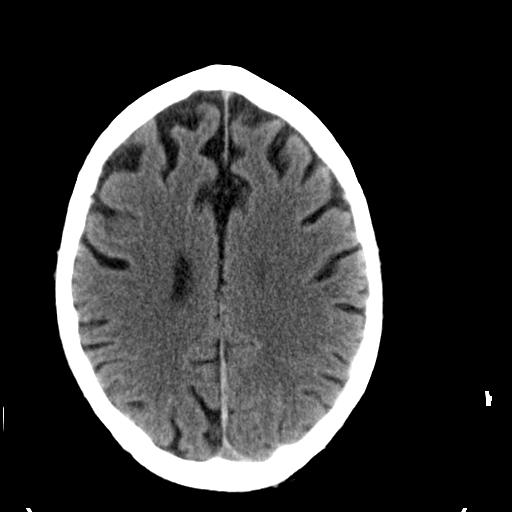
[im 28/40  brain]
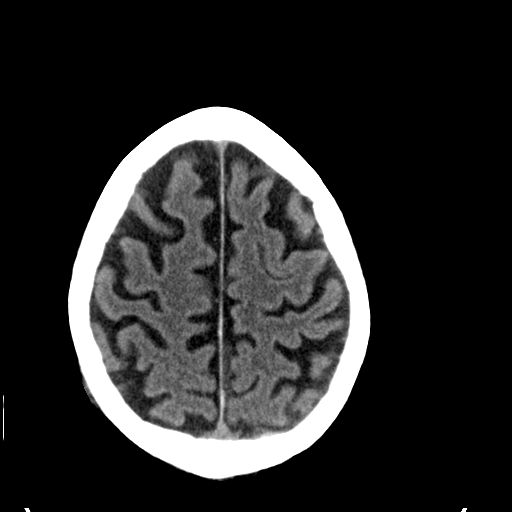
[im 28/40  bone]
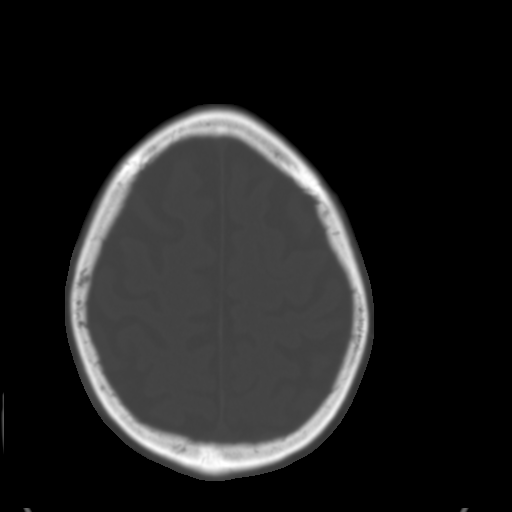
[im 34/40  brain]
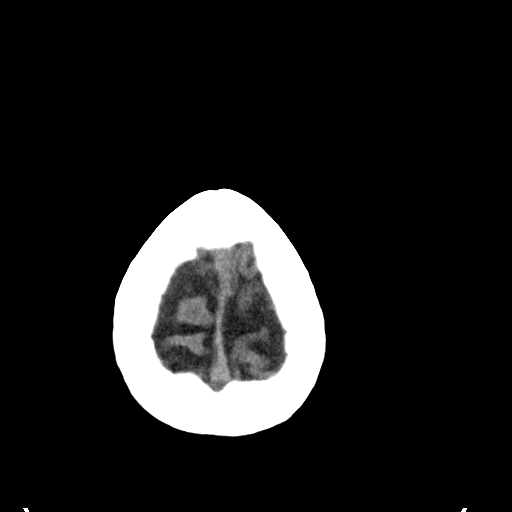

[Series 3: head bone · axial · 0.46mm/px · z∈[+514,+658]mm · 8 of 120 slices shown]
[im 12/120  bone]
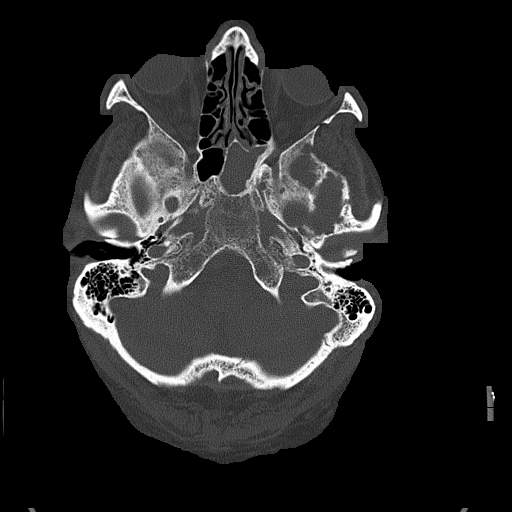
[im 23/120  bone]
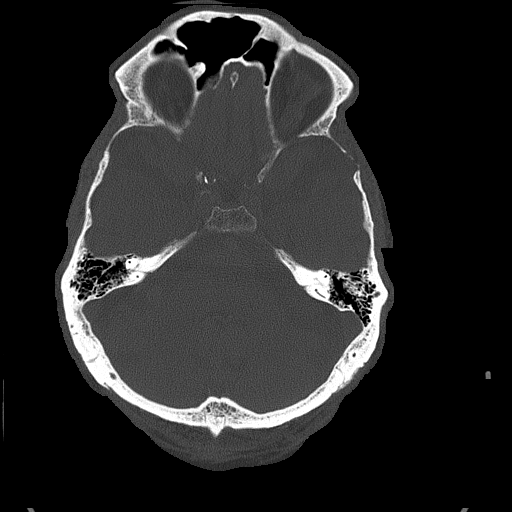
[im 40/120  bone]
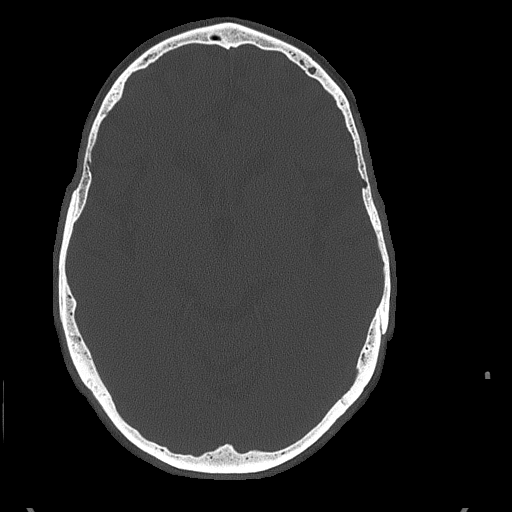
[im 52/120  bone]
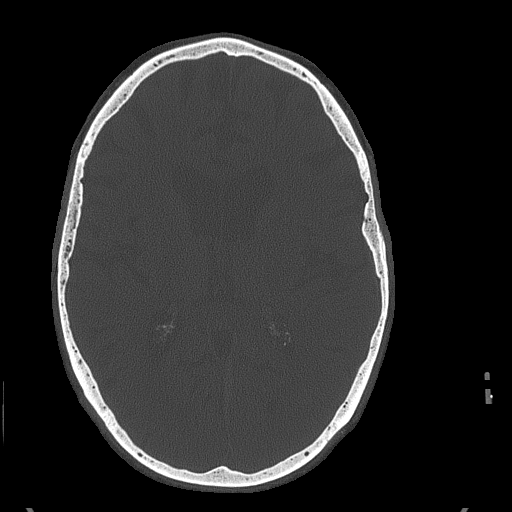
[im 69/120  bone]
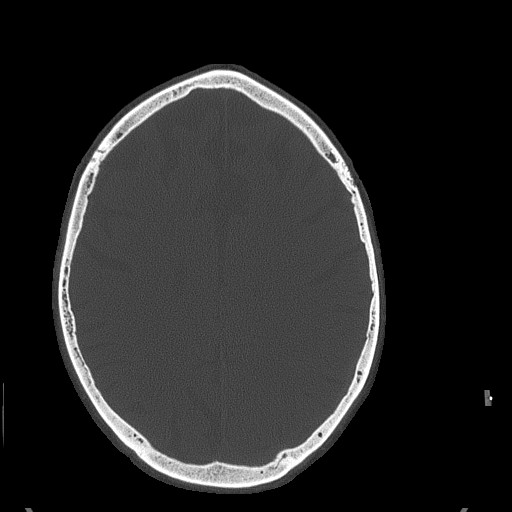
[im 80/120  bone]
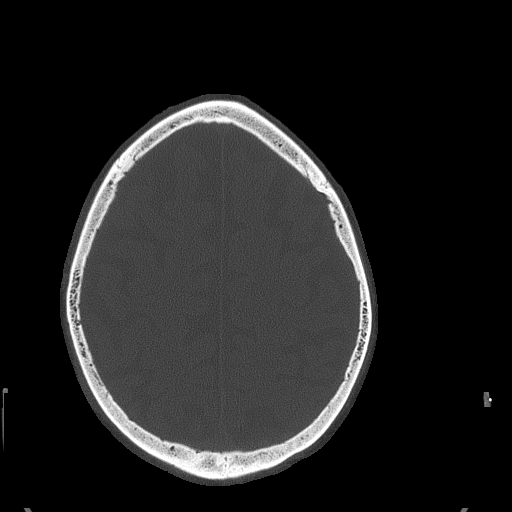
[im 97/120  bone]
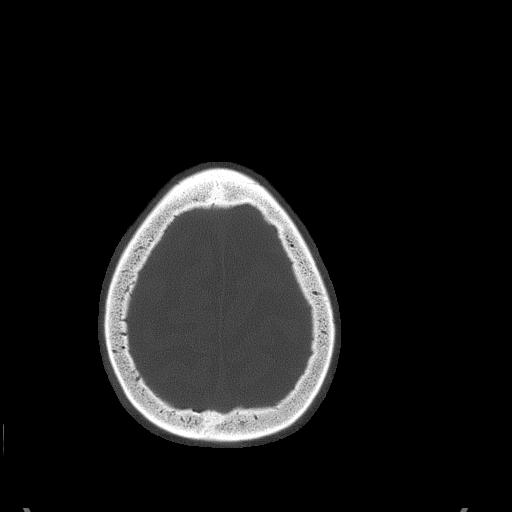
[im 108/120  bone]
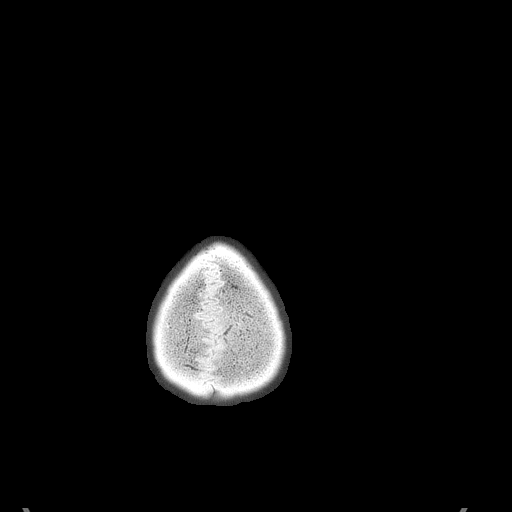

[14 of 30 positions shown; findings below may reference images not displayed]

FINDINGS: Generalized atrophy.

Normal ventricular morphology.

No midline shift or mass effect.

Posterior left parafalcine subdural hematoma up to 5 mm thick.

No significant mass effect.

No intraparenchymal hemorrhage, intracranial mass lesion or evidence
acute infarction.

No additional extra-axial fluid collections.

Bones appear diffusely demineralized.

Opacified sphenoid sinus.

Minimal right parietal scalp hematoma.

No calvarial fracture.
IMPRESSION: CT HEAD:

Small posterior left parafalcine subdural hematoma up to 5 mm thick
without significant mass effect.

Generalized atrophy.

Critical Value/emergent results were called by telephone at the time
of interpretation on 02/24/2013 at [DATE] to Dr.JAM SHID SADA , who
verbally acknowledged these results.

## 2015-10-13 IMAGING — CR RIGHT ANKLE - COMPLETE 3+ VIEW
1 series · 3 of 3 positions shown · non-contrast
Comparison: None.

CLINICAL DATA: Initial encounter, fall. Found down. Left ankle
pain, cannot bear weight.

EXAM:
RIGHT ANKLE - COMPLETE 3+ VIEW

[Series 1: x ankle ap right · 0.14mm/px · 3 of 3 slices shown]
[im 1/3]
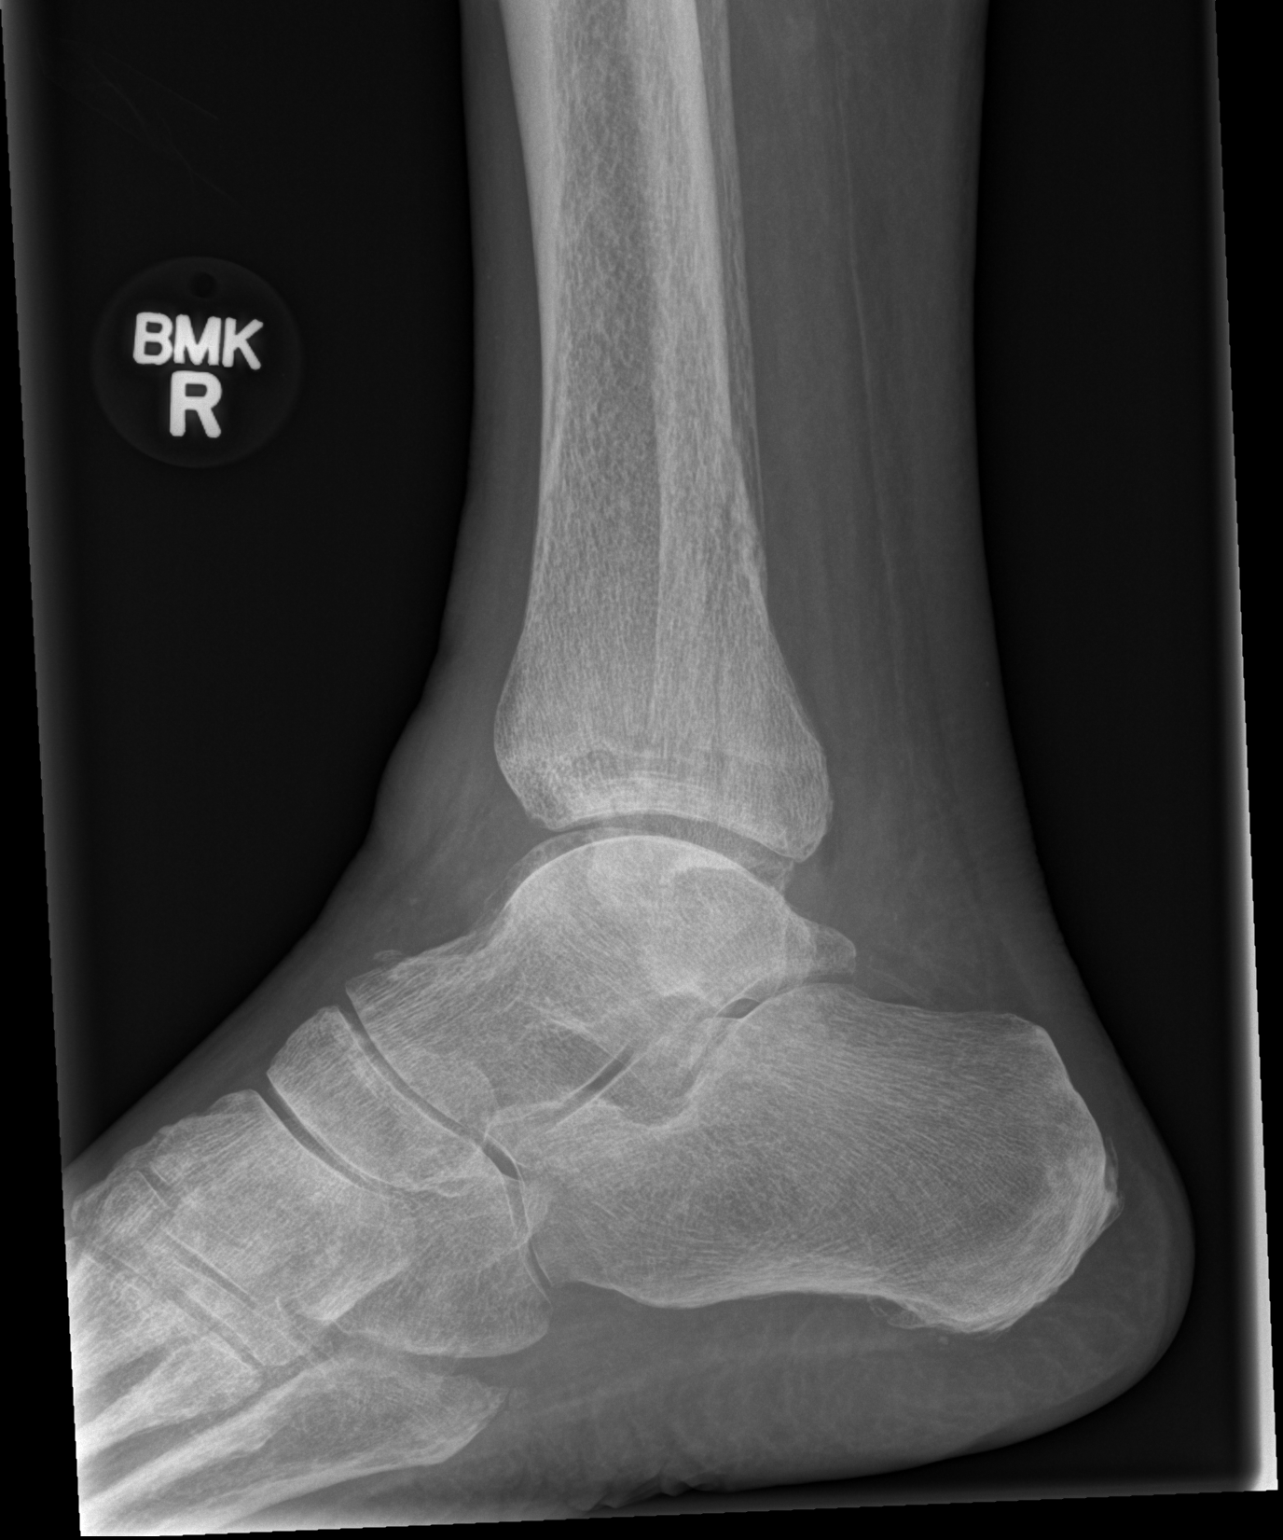
[im 2/3]
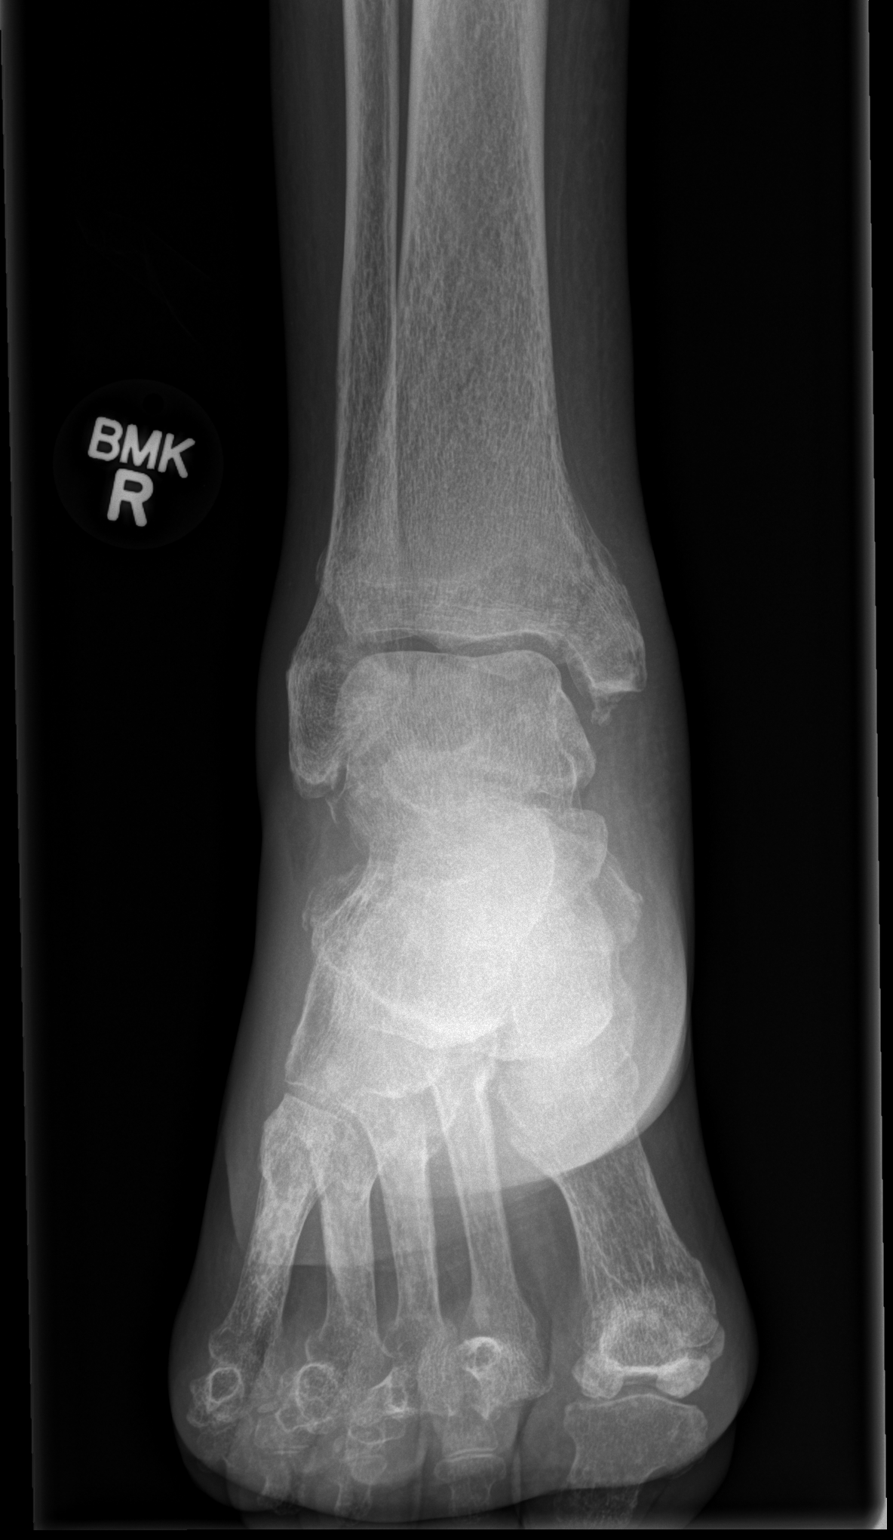
[im 3/3]
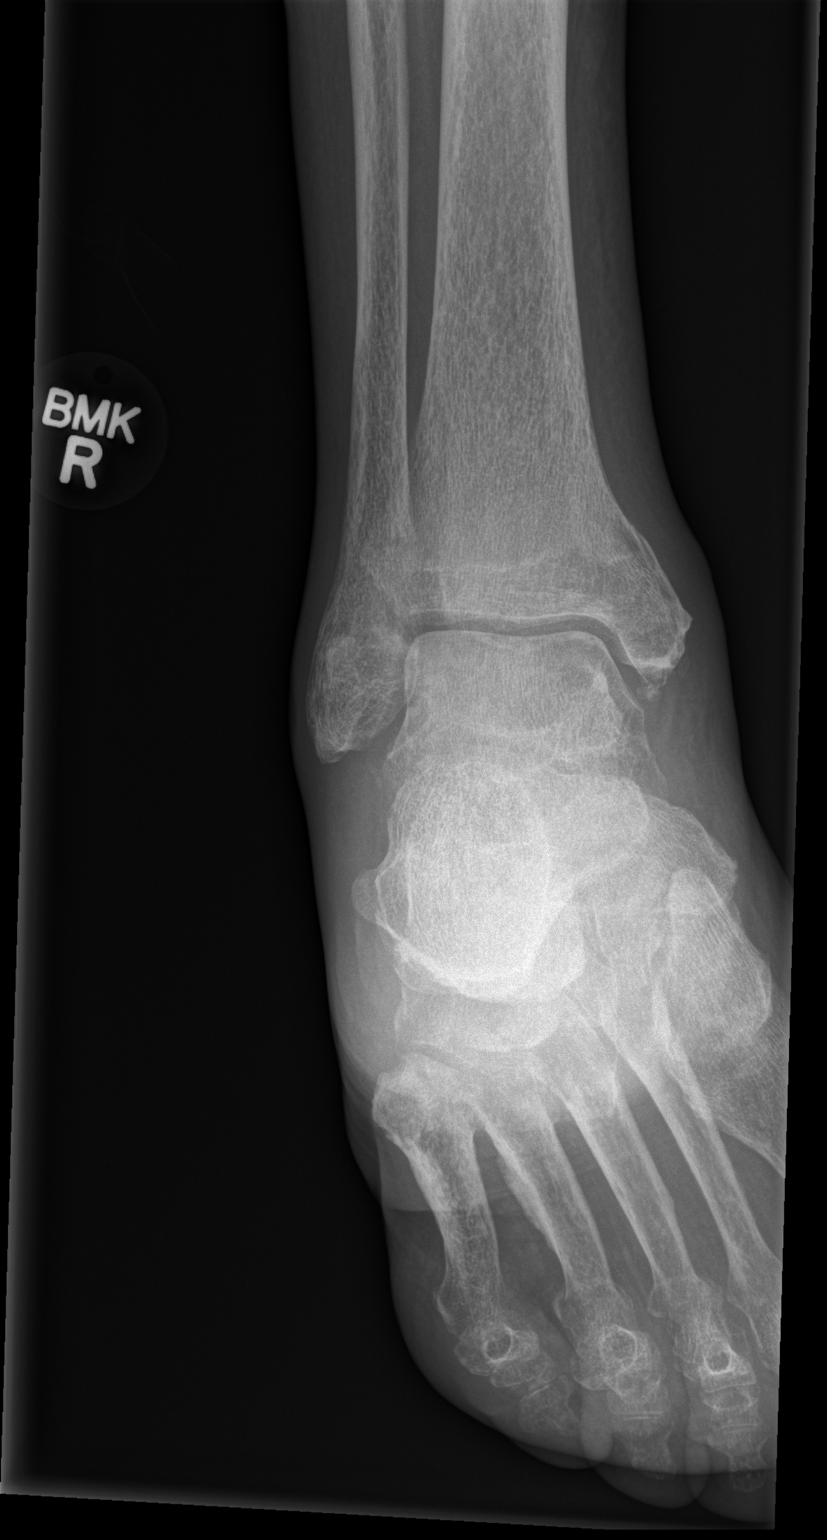

[3 of 3 positions shown; findings below may reference images not displayed]

FINDINGS: There is a vertically oriented nondisplaced fracture of the distal
tibial metadiaphysis. Nondisplaced fracture of the medial malleolus
is seen as well. Ankle mortise is intact. The talar dome may be
minimally depressed. Possible tiny avulsion fractures off the
anterior and lateral aspects of the talus as well as along the
lateral aspect of the foot.
IMPRESSION: 1. Nondisplaced distal tibial and medial malleolar fractures.
2. Question slight depression of the talar dome.
3. Question additional small avulsion fractures off the talus and
lateral aspect of the foot.

## 2015-10-14 IMAGING — CR DG CHEST 1V PORT
1 series · 1 of 1 positions shown · non-contrast
Comparison: 11/23/2013 and 12/26/2012

CLINICAL DATA: Shortness of breath.

EXAM:
PORTABLE CHEST - 1 VIEW

[ap]
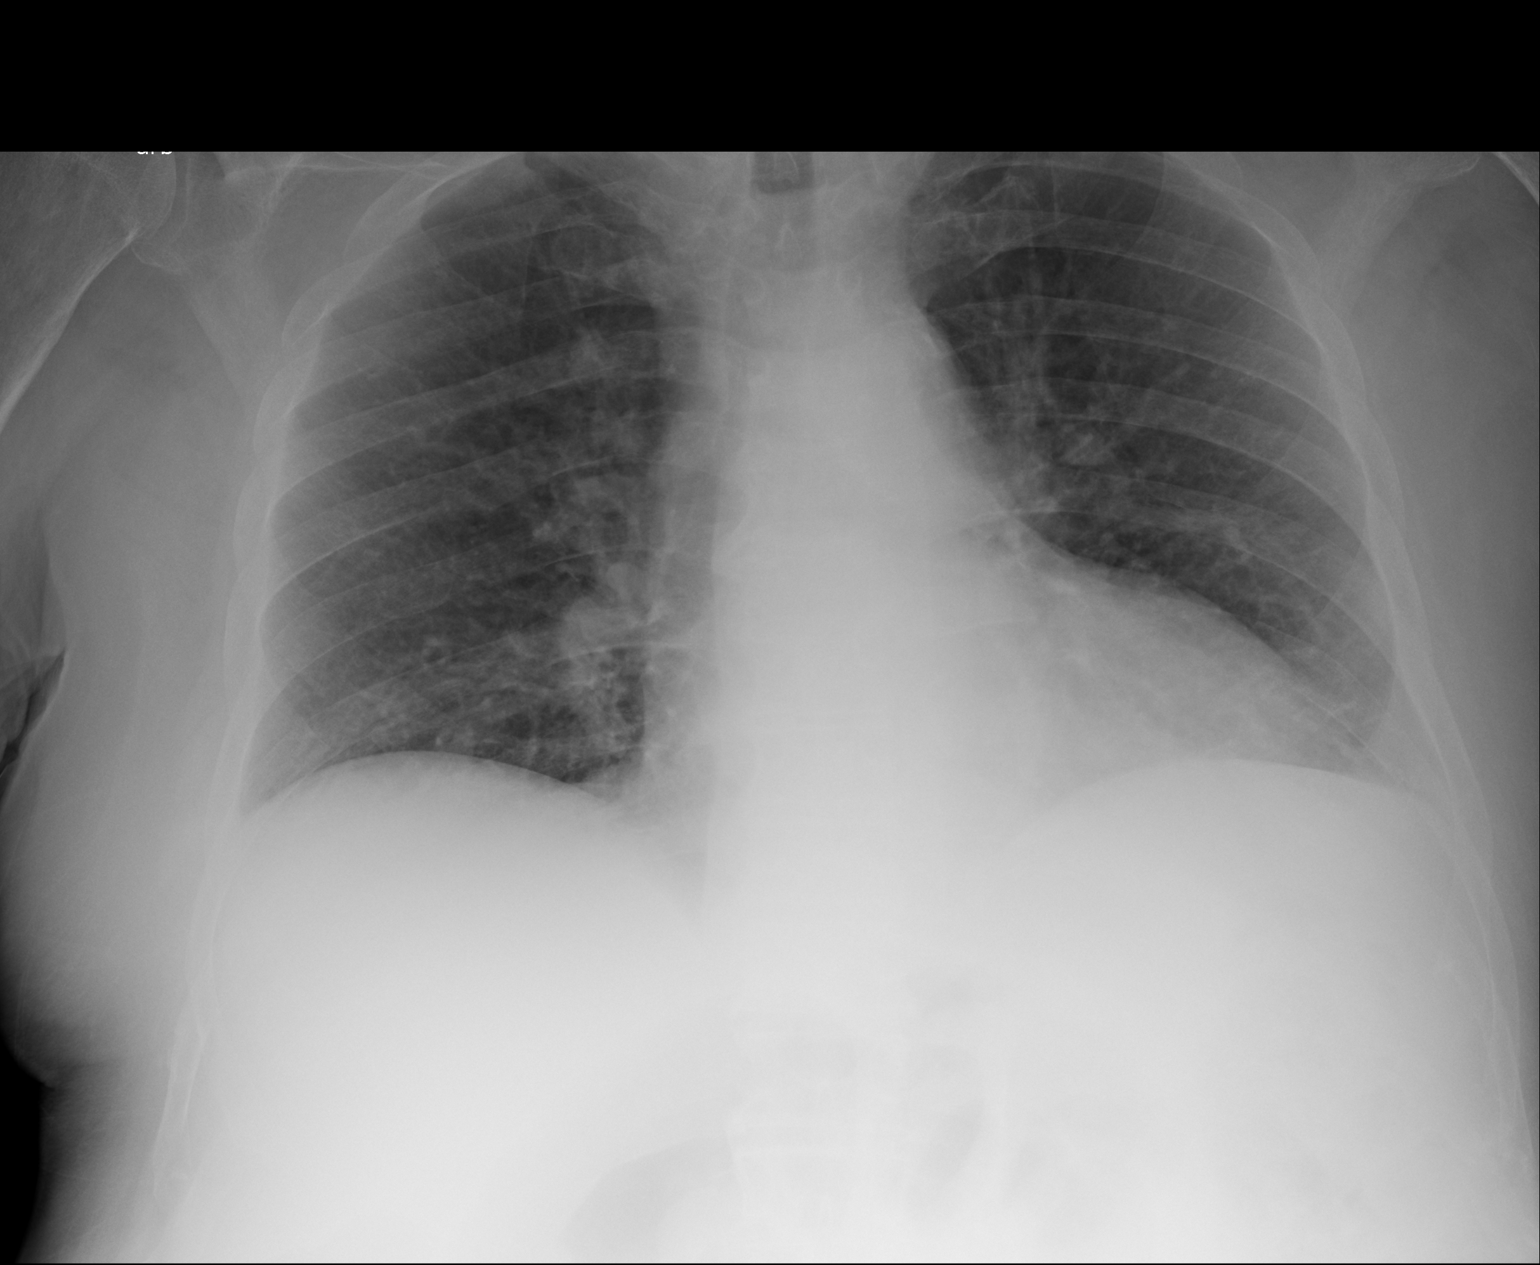

[1 of 1 positions shown; findings below may reference images not displayed]

FINDINGS: Lungs are hypoinflated without consolidation or effusion. There is
mild stable cardiomegaly. Remainder the exam is unchanged.
IMPRESSION: Hypoinflation without acute cardiopulmonary disease.
